# Patient Record
Sex: Male | Born: 1990 | Race: White | Hispanic: No | Marital: Single | Smoking: Never smoker
Health system: Southern US, Community
[De-identification: ages and names within clinical notes are randomized; demographics above are authoritative.]

---
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
## 2015-07-26 ENCOUNTER — Encounter (HOSPITAL_BASED_OUTPATIENT_CLINIC_OR_DEPARTMENT_OTHER): Payer: Self-pay | Admitting: Emergency Medicine

## 2015-07-26 ENCOUNTER — Emergency Department (HOSPITAL_BASED_OUTPATIENT_CLINIC_OR_DEPARTMENT_OTHER)
Admission: EM | Admit: 2015-07-26 | Discharge: 2015-07-26 | Disposition: A | Discharge status: Home / Self Care | Payer: No Typology Code available for payment source | Attending: Emergency Medicine | Admitting: Emergency Medicine

## 2015-07-26 DIAGNOSIS — S3992XA Unspecified injury of lower back, initial encounter: Secondary | ICD-10-CM | POA: Diagnosis not present

## 2015-07-26 DIAGNOSIS — M545 Low back pain, unspecified: Secondary | ICD-10-CM

## 2015-07-26 DIAGNOSIS — Y92410 Unspecified street and highway as the place of occurrence of the external cause: Secondary | ICD-10-CM | POA: Insufficient documentation

## 2015-07-26 DIAGNOSIS — Y9389 Activity, other specified: Secondary | ICD-10-CM | POA: Insufficient documentation

## 2015-07-26 DIAGNOSIS — Y998 Other external cause status: Secondary | ICD-10-CM | POA: Diagnosis not present

## 2015-07-26 MED ORDER — IBUPROFEN 800 MG PO TABS: 800.0000 mg | ORAL_TABLET | Freq: Three times a day (TID) | ORAL | Status: DC | PRN

## 2015-07-26 MED ORDER — METHOCARBAMOL 500 MG PO TABS: 500.0000 mg | ORAL_TABLET | Freq: Four times a day (QID) | ORAL | Status: DC | PRN
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
## 2015-08-05 ENCOUNTER — Emergency Department (HOSPITAL_BASED_OUTPATIENT_CLINIC_OR_DEPARTMENT_OTHER): Payer: No Typology Code available for payment source

## 2015-08-05 ENCOUNTER — Emergency Department (HOSPITAL_BASED_OUTPATIENT_CLINIC_OR_DEPARTMENT_OTHER)
Admission: EM | Admit: 2015-08-05 | Discharge: 2015-08-06 | Disposition: A | Discharge status: Home / Self Care | Payer: No Typology Code available for payment source | Attending: Emergency Medicine | Admitting: Emergency Medicine

## 2015-08-05 ENCOUNTER — Encounter (HOSPITAL_BASED_OUTPATIENT_CLINIC_OR_DEPARTMENT_OTHER): Payer: Self-pay | Admitting: *Deleted

## 2015-08-05 DIAGNOSIS — M545 Low back pain: Secondary | ICD-10-CM | POA: Insufficient documentation

## 2015-08-05 DIAGNOSIS — M546 Pain in thoracic spine: Secondary | ICD-10-CM | POA: Diagnosis not present

## 2015-08-05 DIAGNOSIS — M549 Dorsalgia, unspecified: Secondary | ICD-10-CM
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
## 2015-08-06 MED ORDER — HYDROCODONE-ACETAMINOPHEN 5-325 MG PO TABS: 1.0000 | ORAL_TABLET | Freq: Four times a day (QID) | ORAL | Status: DC | PRN

## 2015-08-06 MED ORDER — DIAZEPAM 5 MG PO TABS: 5.0000 mg | ORAL_TABLET | Freq: Three times a day (TID) | ORAL | Status: DC | PRN

## 2015-08-06 MED ORDER — KETOROLAC TROMETHAMINE 30 MG/ML IJ SOLN: 30.0000 mg | Freq: Once | INTRAMUSCULAR | Status: DC
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
## 2015-08-09 ENCOUNTER — Ambulatory Visit (INDEPENDENT_AMBULATORY_CARE_PROVIDER_SITE_OTHER): Payer: Self-pay | Admitting: Family Medicine

## 2015-08-09 ENCOUNTER — Encounter: Payer: Self-pay | Admitting: Family Medicine

## 2015-08-09 VITALS — BP 112/42 | HR 65 | Ht 72.0 in | Wt 195.0 lb

## 2015-08-09 DIAGNOSIS — M545 Low back pain, unspecified: Secondary | ICD-10-CM

## 2015-08-09 MED ORDER — PREDNISONE 10 MG PO TABS: ORAL_TABLET | ORAL | Status: DC
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
## 2015-08-10 ENCOUNTER — Ambulatory Visit: Payer: No Typology Code available for payment source | Attending: Family Medicine | Admitting: Physical Therapy

## 2015-08-10 ENCOUNTER — Encounter: Payer: Self-pay | Admitting: Physical Therapy

## 2015-08-10 DIAGNOSIS — M545 Low back pain: Secondary | ICD-10-CM | POA: Insufficient documentation

## 2015-08-10 DIAGNOSIS — M79605 Pain in left leg: Secondary | ICD-10-CM

## 2015-08-11 DIAGNOSIS — M545 Low back pain, unspecified: Secondary | ICD-10-CM | POA: Insufficient documentation
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
## 2015-08-17 ENCOUNTER — Ambulatory Visit: Payer: No Typology Code available for payment source | Admitting: Physical Therapy

## 2015-08-17 DIAGNOSIS — M545 Low back pain: Secondary | ICD-10-CM | POA: Diagnosis not present

## 2015-08-17 DIAGNOSIS — M79604 Pain in right leg: Secondary | ICD-10-CM

## 2015-08-17 NOTE — Therapy (Signed)
[facility] [address]  [address] [place], [place], [address] Phone: [phone]   Fax:  [phone]  Physical Therapy Treatment  Patient Details  Name: Jeremy Bridges MRN: [id number] Date of Birth: Feb 05, 1991 Referring Provider: Norton Blizzard  Encounter Date: 08/17/2015      PT End of Session - 08/17/15 1528    Visit Number 2   Number of Visits 12   Date for PT Re-Evaluation 09/22/15   PT Start Time 1446   PT Stop Time 1541   PT Time Calculation (min) 55 min   Activity Tolerance Patient tolerated treatment well;No increased pain   Behavior During Therapy [facility] for tasks assessed/performed      No past medical history on file.  No past surgical history on file.  There were no vitals filed for this visit.  Visit Diagnosis:  LBP radiating to both legs      Subjective Assessment - 08/17/15 1447    Subjective Back is "hit or miss."  Today c/o cramping, but Friday and Sunday were "rough."  Drove a lot on Sunday.  Back is fine up walking.  Pone on elbows stretch hurts: "feels like something is grinding together."   Patient Stated Goals get rid of pain, allow return to gym   Currently in Pain? Yes   Pain Score 2    Pain Location Back   Pain Orientation Lower   Pain Descriptors / Indicators Cramping   Pain Onset 1 to 4 weeks ago   Pain Frequency Constant   Aggravating Factors  prolonged sitting, prolonged standing, resting after being active   Pain Relieving Factors medication                         [facility] Adult PT Treatment/Exercise - 08/17/15 1449    Exercises   Exercises Lumbar   Lumbar Exercises: Stretches   Passive Hamstring Stretch 3 reps;20 seconds   Passive Hamstring Stretch Limitations supine with strap   Single Knee to Chest Stretch 3 reps;20 seconds   Lower Trunk Rotation 3 reps;20 seconds   Lumbar Exercises: Aerobic   Stationary Bike NuStep Level 6 x 5 min   Lumbar Exercises:  Quadruped   Opposite Arm/Leg Raise Right arm/Left leg;Left arm/Right leg;10 reps   Plank alt hip ext 3x20 sec   Modalities   Modalities Traction   Traction   Type of Traction Lumbar  mat slightly lowered to maximize lumbar pull   Min (lbs) 45   Max (lbs) 60   Hold Time 60   Rest Time 20   Time 15                PT Education - 08/17/15 1526    Education provided Yes   Education Details updated HEP   Person(s) Educated Patient   Methods Explanation;Demonstration;Handout   Comprehension Verbalized understanding;Returned demonstration          PT Short Term Goals - 08/17/15 1530    PT SHORT TERM GOAL #1   Title pt independent with initial HEP by 08/20/15   Status Achieved           PT Long Term Goals - 08/17/15 1530    PT LONG TERM GOAL #1   Title pt able to return to full particiaption in gym-based exercise and resistance training by 09/22/15   Status On-going   PT LONG TERM GOAL #2   Title pt able to hold / carry  is child without c/o LBP by 09/22/15   Status On-going   PT LONG TERM GOAL #3   Title pt able to sleep without need for medication and without limitation by LBP pain by 09/22/15   Status On-going   PT LONG TERM GOAL #4   Title pt able to perform all job duties to include driving without limitaion by LBP by 09/22/15   Status On-going               Plan - 08/17/15 1528    Clinical Impression Statement Pt tolerated session well today without increase in pain and pain only 2/10 today.  Extension based exercises increase pain with c/o "grinding" sensation.  Recommended d/c of extension based exercises.  Performed core and stretching/flexion based exercises today without increase in pain.   PT Next Visit Plan trunk stability as tolerated, stretch B HS, progress mechanical traction as tolerated - may change to prone?, taping and possible dry needling PRN   Consulted and Agree with Plan of Care Patient        Problem List Patient Active Problem  List   Diagnosis Date Noted  . Low back pain 08/11/2015   Clarita CraneStephanie F Samanatha Brammer, PT, DPT 08/17/2015 3:41 PM  [facility] [address]  [address] [place], [place], [address] Phone: [phone]   Fax:  [phone]  Name: Jeremy Bridges MRN: [id number] Date of Birth: 1991/08/15

## 2015-08-17 NOTE — Patient Instructions (Signed)
Hamstring Step 1    Straighten left knee. Keep knee level with other knee or on bolster. Hold _20__ seconds. Relax knee by returning foot to start. Repeat with other leg. Repeat _3__ times.  Copyright  VHI. All rights reserved.    Plank    Support body on ELBOWS and feet. Keep hips in line with torso and arms straight under chest. Avoid locking elbows. Extend one leg up, alternating for 20 seconds.   Repeat 3 times. ADVANCED: Extend one leg up.  Copyright  VHI. All rights reserved.

## 2015-08-19 ENCOUNTER — Ambulatory Visit: Payer: No Typology Code available for payment source | Admitting: Physical Therapy

## 2015-08-19 DIAGNOSIS — M545 Low back pain: Secondary | ICD-10-CM

## 2015-08-19 DIAGNOSIS — M79604 Pain in right leg: Secondary | ICD-10-CM

## 2015-08-19 NOTE — Therapy (Signed)
[facility] [place] [address]  [address] [place], [place], [address] Phone: [phone]   Fax:  [phone]  Physical Therapy Treatment  Patient Details  Name: Edison PaceRichard D Geddis MRN: [id number] Date of Birth: March 14, 1991 Referring Provider: Norton BlizzardShane Hudnall  Encounter Date: 08/19/2015      PT End of Session - 08/19/15 1458    Visit Number 3   Number of Visits 12   Date for PT Re-Evaluation 09/22/15   PT Start Time 1450   PT Stop Time 1538   PT Time Calculation (min) 48 min      No past medical history on file.  No past surgical history on file.  There were no vitals filed for this visit.  Visit Diagnosis:  LBP radiating to both legs      Subjective Assessment - 08/19/15 1451    Subjective States went to see chiro Tuesday and felt great for 3 hours following this but then pain returned. He states chiro advised him to not have traction while at PT because he wants to do that manually with him.  States noted pain while working today states he typically does not experience back pain while moving and being active rather usually after being active.  Noted n/t in R LE yesterday while sitting on stool in forward flexed posture.  States n/t abolished after walked around and hasn't returned.   Currently in Pain? Yes   Pain Score --  2-3/10 while working today; currently just sore   Pain Location Back   Pain Orientation Lower          TODAY'S TREATMENT TherEx - Stretch B HS R LE nerve glides L-spine extension self-mobes with towel roll to L3 area x 3' POE 2' (states lower l-spine grind pain not present just a numb sensation where towel was) Quadruped LE/UE 10x 3-way prayer stretch 2x20" each TRX DL Squat 95M15x (focus on maintaining l-spine lordosis due to tendency to tuck into flat l-spine with posterior pelvic tilt) TRX SL Squat 10x each TRX Y 10x        PT Education - 08/19/15 1519    Education provided Yes   Education Details instructed in extension approach to perform if notes radicular symptoms; encouraged regular latissimus stretching during resistance training   Person(s) Educated Patient   Methods Explanation;Demonstration   Comprehension Verbalized understanding;Returned demonstration          PT Short Term Goals - 08/17/15 1530    PT SHORT TERM GOAL #1   Title pt independent with initial HEP by 08/20/15   Status Achieved           PT Long Term Goals - 08/17/15 1530    PT LONG TERM GOAL #1   Title pt able to return to full particiaption in gym-based exercise and resistance training by 09/22/15   Status On-going   PT LONG TERM GOAL #2   Title pt able to hold / carry is child without c/o LBP by 09/22/15   Status On-going   PT LONG TERM GOAL #3   Title pt able to sleep without need for medication and without limitation by LBP pain by 09/22/15   Status On-going   PT LONG TERM GOAL #4   Title pt able to perform all job duties to include driving without limitaion by LBP by 09/22/15   Status On-going               Plan - 08/19/15 1801  Clinical Impression Statement Mr. Babilonia is being seen by PT as well as chiropractor.  He noted releif of symptoms after Tuesday (had PT then immediately following had a chiro appointment) stating was pain-free for 3 hours after treatments.  His pain returned and he also noted n/t throughoug R LE yesterday while performing seated work in forward flexed posture.  Reviewed disc mechanics again with pt and encouraged extension approach immediatly if he notes radicular symptoms.  Following chiro appointment, l-spine extension some easier and then easier still following mid l-spine self-mob with towel roll today.  Pt should improve well and quickly if he can avoid excessive flexion.  Pt states his chiro advised to not have traction here as he plans on performing that at his clinic manually and pt would like to abide by this.   PT Next Visit Plan trunk  stability training as tolerated with some extension bias approach; stretch B HS and lats; taping and possible dry needling PRN   Consulted and Agree with Plan of Care Patient        Problem List Patient Active Problem List   Diagnosis Date Noted  . Low back pain 08/11/2015    Havish Petties PT, OCS 08/19/2015, 6:08 PM  [facility] [address]  [address] [place], [place], [address] Phone: [phone]   Fax:  [phone]  Name: DAMIAN BUCKLES MRN: [id number] Date of Birth: Jan 0Hiemstra1, 1993

## 2015-08-24 ENCOUNTER — Ambulatory Visit: Payer: No Typology Code available for payment source | Admitting: Physical Therapy

## 2015-08-24 DIAGNOSIS — M79604 Pain in right leg: Secondary | ICD-10-CM

## 2015-08-24 DIAGNOSIS — M545 Low back pain: Secondary | ICD-10-CM

## 2015-08-24 NOTE — Therapy (Signed)
[facility] [address]  [address] [place], [place], [address] Phone: [phone]   Fax:  [phone]  Physical Therapy Treatment  Patient Details  Name: JOSEPH BIAS MRN: [id number] Date of Birth: [birth year] Referring Provider: Norton Blizzard  Encounter Date: 08/24/2015      PT End of Session - 08/24/15 0936    Visit Number 4   Number of Visits 12   Date for PT Re-Evaluation 09/22/15   PT Start Time 0850   PT Stop Time 0928   PT Time Calculation (min) 38 min   Activity Tolerance Patient tolerated treatment well;No increased pain   Behavior During Therapy [facility] for tasks assessed/performed      No past medical history on file.  No past surgical history on file.  There were no vitals filed for this visit.  Visit Diagnosis:  LBP radiating to both legs      Subjective Assessment - 08/24/15 0851    Subjective Back feels good; reports it's getting better.  "Hit or miss"  Driving still causes pain.   Currently in Pain? No/denies                         [facility] Adult PT Treatment/Exercise - 08/24/15 0854    Lumbar Exercises: Stretches   Passive Hamstring Stretch 3 reps;20 seconds   Passive Hamstring Stretch Limitations supine with strap   Prone on Elbows Stretch 2 reps;60 seconds   Prone Mid Back Stretch 3 reps;30 seconds   Prone Mid Back Stretch Limitations mid/R/L   Lumbar Exercises: Aerobic   Stationary Bike Elliptical x 5 min level 3   Lumbar Exercises: Supine   Bridge 15 reps   Bridge Limitations with strap for isometric hip abdct   Other Supine Lumbar Exercises isometric hip ext into physioball 15x5sec bil   Lumbar Exercises: Prone   Straight Leg Raise 10 reps   Opposite Arm/Leg Raise Right arm/Left leg;Left arm/Right leg;10 reps                  PT Short Term Goals - 08/17/15 1530    PT SHORT TERM GOAL #1   Title pt independent with initial HEP by 08/20/15   Status  Achieved           PT Long Term Goals - 08/17/15 1530    PT LONG TERM GOAL #1   Title pt able to return to full particiaption in gym-based exercise and resistance training by 09/22/15   Status On-going   PT LONG TERM GOAL #2   Title pt able to hold / carry is child without c/o LBP by 09/22/15   Status On-going   PT LONG TERM GOAL #3   Title pt able to sleep without need for medication and without limitation by LBP pain by 09/22/15   Status On-going   PT LONG TERM GOAL #4   Title pt able to perform all job duties to include driving without limitaion by LBP by 09/22/15   Status On-going               Plan - 08/24/15 0936    Clinical Impression Statement Pt tolerated exercises well without increase in pain today.  Continues to have periods of pain, especially with driving.  Discussed lumbar support for driving to assist with pain.   PT Next Visit Plan trunk stability training as tolerated with some extension bias approach; stretch B HS and lats; taping  and possible dry needling PRN   Consulted and Agree with Plan of Care Patient        Problem List Patient Active Problem List   Diagnosis Date Noted  . Low back pain 08/11/2015   Clarita CraneStephanie F Willena Jeancharles, PT, DPT 08/24/2015 9:38 AM  [facility] [place] [address]  [address] [place], [place], [address] Phone: [phone]   Fax:  [phone]  Name: Edison PaceRichard D Gordner MRN: [id number] Date of Birth: 02-05-1991

## 2015-08-26 ENCOUNTER — Ambulatory Visit: Payer: No Typology Code available for payment source | Admitting: Physical Therapy

## 2015-08-26 DIAGNOSIS — M545 Low back pain: Secondary | ICD-10-CM

## 2015-08-26 DIAGNOSIS — M79605 Pain in left leg: Secondary | ICD-10-CM

## 2015-08-26 NOTE — Therapy (Signed)
[facility] [address]  [address] [place], [place], [address] Phone: [phone]   Fax:  [phone]  Physical Therapy Treatment  Patient Details  Name: Jeremy Bridges MRN: [id number] Date of Birth: 1991/02/11 Referring Provider: Norton Blizzard  Encounter Date: 08/26/2015      PT End of Session - 08/26/15 1505    Visit Number 5   Number of Visits 12   Date for PT Re-Evaluation 09/22/15   PT Start Time 1502  pt late   PT Stop Time 1534   PT Time Calculation (min) 32 min      No past medical history on file.  No past surgical history on file.  There were no vitals filed for this visit.  Visit Diagnosis:  LBP radiating to both legs      Subjective Assessment - 08/26/15 1507    Subjective Reports 1/10 pain yesterday and no pain today.  No N/T since bout 4 days ago noted in L LE.  States feels as though is making good progress.   Currently in Pain? No/denies             TODAY'S TREATMENT TherEx - Stretch B HS B LE nerve glides Superman 8x5" 3-way prayer stretch 2x20" each L-spine extension self-mobes with towel roll to L3 area x 3' DL Bridge 69G SL Bridge 29B each TRX Hip Hinge into Y 10x TRX High Row 10x (focus on maintaining neutral trunk) TRX Push-up 10x (focus on maintaining neutral trunk) Free Squat with 10# DB at 90dg shoulder flexion 15x  Doorway Lunge 10x each           PT Short Term Goals - 08/17/15 1530    PT SHORT TERM GOAL #1   Title pt independent with initial HEP by 08/20/15   Status Achieved           PT Long Term Goals - 08/17/15 1530    PT LONG TERM GOAL #1   Title pt able to return to full particiaption in gym-based exercise and resistance training by 09/22/15   Status On-going   PT LONG TERM GOAL #2   Title pt able to hold / carry is child without c/o LBP by 09/22/15   Status On-going   PT LONG TERM GOAL #3   Title pt able to sleep without need for medication  and without limitation by LBP pain by 09/22/15   Status On-going   PT LONG TERM GOAL #4   Title pt able to perform all job duties to include driving without limitaion by LBP by 09/22/15   Status On-going               Plan - 08/26/15 1632    Clinical Impression Statement pt is progressing very well lately stating pain has been minimal lately and last bout of n/t was 4 days ago (which may have been more related to sitting on bucket causing peripheral nerve impingement ratther than l-spine nerve root impingment).  No pain with any activities during today's treatment.   PT Next Visit Plan trunk stability training as tolerated with some extension bias approach; stretch B HS and lats; taping and possible dry needling PRN   Consulted and Agree with Plan of Care Patient        Problem List Patient Active Problem List   Diagnosis Date Noted  . Low back pain 08/11/2015    Preslyn Warr PT, OCS 08/26/2015, 4:35 PM  [facility]  [place] [address]  [address] [place], [place], [address] Phone: [phone]   Fax:  [phone]  Name: Jeremy Bridges MRN: [id number] Date of Birth: [birth year]

## 2015-08-31 ENCOUNTER — Ambulatory Visit: Payer: No Typology Code available for payment source | Admitting: Physical Therapy

## 2015-08-31 DIAGNOSIS — M545 Low back pain: Secondary | ICD-10-CM

## 2015-08-31 DIAGNOSIS — M79604 Pain in right leg: Secondary | ICD-10-CM

## 2015-08-31 NOTE — Therapy (Signed)
[facility] [address]  [place], [place], [address] Phone: [phone]   Fax:  [phone]  Physical Therapy Treatment  Patient Details  Name: Jeremy Bridges MRN: [id number] Date of Birth: 1991/03/01 Referring Provider: Karlton Lemon  Encounter Date: 08/31/2015      PT End of Session - 08/31/15 1455    Visit Number 6   Number of Visits 12   Date for PT Re-Evaluation 09/22/15   PT Start Time 1450   PT Stop Time 1531   PT Time Calculation (min) 41 min      No past medical history on file.  No past surgical history on file.  There were no vitals filed for this visit.  Visit Diagnosis:  LBP radiating to both legs      Subjective Assessment - 08/31/15 1455    Subjective States LBP has been 0-1/10 lately with most pain noted while performing resisted l-spine extension at gym.  Denies noting n/t lately.   Currently in Pain? No/denies             TODAY'S TREATMENT TherEx - NuStep lvl 8, 4' Stretch B HS SL Bridge 15x each L-spine extension self-mobes with towel roll to L3 area x 3' In/out abdominals 44R (intermittent clicking in R anterior hip, abolished for a few reps with direct pressure to the area) Single Hand Low Row 35# 15x each Staggered Standing Mid Pulley one-arm row 15x each Fitter BW Lunge 2 Black with B Pole A  Staggered Standing mid pulley punch diagonal 20# 15x each TRX High Row 15x (focus on maintaining neutral trunk) TRX Push-up 15x (focus on maintaining neutral trunk) Free Squat on BOSU (down) with 10# DB at 90dg shoulder flexion 15x              PT Short Term Goals - 08/17/15 1530    PT SHORT TERM GOAL #1   Title pt independent with initial HEP by 08/20/15   Status Achieved           PT Long Term Goals - 08/31/15 1458    PT LONG TERM GOAL #1   Title pt able to return to full particiaption in gym-based exercise and resistance training by 09/22/15   Status  On-going   PT LONG TERM GOAL #2   Title pt able to hold / carry is child without c/o LBP by 09/22/15   Status Achieved   PT LONG TERM GOAL #3   Title pt able to sleep without need for medication and without limitation by LBP pain by 09/22/15   Status Achieved   PT LONG TERM GOAL #4   Title pt able to perform all job duties to include driving without limitaion by LBP by 09/22/15  continued discomfort with driving   Status Partially Met               Plan - 08/31/15 1533    Clinical Impression Statement Continued good progress - states is able to sleep without issue and is able to hold child without pain but still has pain with driving. Also noted short bout of pain with performing resisted trunk extension at gym over the weekend.  Denies noting LE n/t since last treatment.   PT Next Visit Plan progress trunk stability and hip extension   Consulted and Agree with Plan of Care Patient        Problem List Patient Active Problem List   Diagnosis Date Noted  .  Low back pain 08/11/2015    Susanne Baumgarner PT, OCS 08/31/2015, 4:17 PM  [facility] [address]  [place], [place], [address] Phone: [phone]   Fax:  [phone]  Name: Jeremy Bridges MRN: [id number] Date of Birth: 03/26/1991

## 2015-09-02 ENCOUNTER — Ambulatory Visit: Payer: No Typology Code available for payment source | Admitting: Physical Therapy

## 2015-09-02 DIAGNOSIS — M545 Low back pain, unspecified: Secondary | ICD-10-CM

## 2015-09-02 NOTE — Therapy (Signed)
[place] [address]  [place], [place], [address] Phone: [phone]   Fax:  [phone]  Physical Therapy Treatment  Patient Details  Name: Jeremy Bridges MRN: [id number] Date of Birth: 07/31/1991 Referring Provider: Karlton Lemon  Encounter Date: 09/02/2015      PT End of Session - 09/02/15 0931    Visit Number 7   Number of Visits 12   Date for PT Re-Evaluation 09/22/15   PT Start Time 0846   PT Stop Time 0924   PT Time Calculation (min) 38 min   Activity Tolerance Patient tolerated treatment well;No increased pain   Behavior During Therapy [facility] for tasks assessed/performed      No past medical history on file.  No past surgical history on file.  There were no vitals filed for this visit.  Visit Diagnosis:  LBP radiating to both legs      Subjective Assessment - 09/02/15 0849    Subjective No pain no radicular symptoms in past couple days.  No issues with driving since last week.   Currently in Pain? No/denies                         [facility] Adult PT Treatment/Exercise - 09/02/15 0850    Lumbar Exercises: Aerobic   Stationary Bike NuStep x 3 min L 8   Elliptical L5 x 3 min   Lumbar Exercises: Standing   Functional Squats 20 reps   Functional Squats Limitations on BOSU   Other Standing Lumbar Exercises trunk rotation with blue med ball x15 bil   Other Standing Lumbar Exercises squats with blue med ball and shoulder flexion x 15 on BOSU   Lumbar Exercises: Supine   Heel Slides 20 reps   Heel Slides Limitations alternating with ab set   Bent Knee Raise 20 reps   Bent Knee Raise Limitations alternating with ab set   Bridge 15 reps   Bridge Limitations single limb   Other Supine Lumbar Exercises bridging with hamstring curls on orange pball x 15   Other Supine Lumbar Exercises in/out abdominals x 20   Lumbar Exercises: Prone   Other Prone Lumbar Exercises prone planks 5x15  sec                  PT Short Term Goals - 08/17/15 1530    PT SHORT TERM GOAL #1   Title pt independent with initial HEP by 08/20/15   Status Achieved           PT Long Term Goals - 08/31/15 1458    PT LONG TERM GOAL #1   Title pt able to return to full particiaption in gym-based exercise and resistance training by 09/22/15   Status On-going   PT LONG TERM GOAL #2   Title pt able to hold / carry is child without c/o LBP by 09/22/15   Status Achieved   PT LONG TERM GOAL #3   Title pt able to sleep without need for medication and without limitation by LBP pain by 09/22/15   Status Achieved   PT LONG TERM GOAL #4   Title pt able to perform all job duties to include driving without limitaion by LBP by 09/22/15  continued discomfort with driving   Status Partially Met               Plan - 09/02/15 0932    Clinical Impression Statement Pt presents today  with 1 week of no symptoms.  Does state last time symptoms occurred was when driving for about 1 hour.  Continues to make good progress towards goals.    PT Next Visit Plan progress trunk stability and hip extension   Consulted and Agree with Plan of Care Patient        Problem List Patient Active Problem List   Diagnosis Date Noted  . Low back pain 08/11/2015   Laureen Abrahams, PT, DPT 09/02/2015 9:34 AM  [facility] [address]  [place], [place], [address] Phone: [phone]   Fax:  [phone]  Name: Jeremy Bridges MRN: [id number] Date of Birth: 05-Mar-1991

## 2015-09-03 ENCOUNTER — Encounter: Payer: Self-pay | Admitting: Family Medicine

## 2015-09-03 ENCOUNTER — Ambulatory Visit (INDEPENDENT_AMBULATORY_CARE_PROVIDER_SITE_OTHER): Payer: Self-pay | Admitting: Family Medicine

## 2015-09-03 VITALS — BP 113/75 | HR 68 | Ht 72.0 in | Wt 193.0 lb

## 2015-09-03 DIAGNOSIS — M545 Low back pain, unspecified: Secondary | ICD-10-CM

## 2015-09-03 NOTE — Patient Instructions (Signed)
Continue with home exercises another 4-6 weeks. Transition off physical therapy - continue for next week along with chiropractor. Follow up with me in 6 weeks or as needed.

## 2015-09-07 ENCOUNTER — Ambulatory Visit: Payer: No Typology Code available for payment source | Attending: Family Medicine | Admitting: Physical Therapy

## 2015-09-07 DIAGNOSIS — M79604 Pain in right leg: Secondary | ICD-10-CM

## 2015-09-07 DIAGNOSIS — M545 Low back pain: Secondary | ICD-10-CM | POA: Insufficient documentation
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
## 2015-09-07 NOTE — Therapy (Signed)
[facility] [place] [address]  [place], [place], [address] Phone: [phone]   Fax:  [phone]  Physical Therapy Treatment  Patient Details  Name: Jeremy Bridges MRN: [id number] Date of Birth: [birth year] Referring Provider: Karlton Lemon  Encounter Date: 09/07/2015      PT End of Session - 09/07/15 0858    Visit Number 8   Number of Visits 12   Date for PT Re-Evaluation 09/22/15   PT Start Time 0851   PT Stop Time 0934   PT Time Calculation (min) 43 min      No past medical history on file.  No past surgical history on file.  There were no vitals filed for this visit.  Visit Diagnosis:  LBP radiating to both legs      Subjective Assessment - 09/07/15 0853    Subjective no pain today but states noted some LBP yesterday extending into posterior R thigh to knee while sitting at rest.  States pain noted for approx 3 hours in lower back but only briefling in R LE.   Currently in Pain? No/denies   Pain Score --  4/10 yesterday   Pain Location Back   Pain Orientation --  lower to upper l-spine   Pain Descriptors / Indicators Aching           TODAY'S TREATMENT TherEx - Elliptical lvl 2.0 3' 3-way prayer stretch 2x20" each SKTC and DKTC Stretch B HS Partial Curl-up 20x L-spine extension self-mobes with towel roll to L3 area x 3' SL Bridge 15x each Staggered Standing Mid Pulley 25# one-arm row 15x each, 35# 15x each Fitter BW Lunge 2 Black with B Pole A 15x each, then with 2 Black + 1 Blue 15x each TRX Spiderman Push-up 10x each TRX BodySaw 30" 10x TRX Lunge with foot in TRX 10x each TRX One-Arm Row with Reach 10x each Free Squat on BOSU (down) with 5kg med ball at 90dg shoulder flexion 15x POE 1'           PT Short Term Goals - 08/17/15 1530    PT SHORT TERM GOAL #1   Title pt independent with initial HEP by 08/20/15   Status Achieved           PT Long Term Goals - 08/31/15  1458    PT LONG TERM GOAL #1   Title pt able to return to full particiaption in gym-based exercise and resistance training by 09/22/15   Status On-going   PT LONG TERM GOAL #2   Title pt able to hold / carry is child without c/o LBP by 09/22/15   Status Achieved   PT LONG TERM GOAL #3   Title pt able to sleep without need for medication and without limitation by LBP pain by 09/22/15   Status Achieved   PT LONG TERM GOAL #4   Title pt able to perform all job duties to include driving without limitaion by LBP by 09/22/15  continued discomfort with driving   Status Partially Met               Plan - 09/07/15 0940    Clinical Impression Statement no pain today but did experience LBP yesterday.  Initially noted while sitting and included pain into R LE but no n/t.  LE pain abolished with standing but pt states LBP pain last approx 3 hours at 4/10 intensity.  He states back pain was throughout entire l-spine.  Despite pain  yesterday, pt tolerated progression of intensity with stability training today very well.  No pain in lower back or LE noted.   PT Next Visit Plan progress trunk stability as tolerated   Consulted and Agree with Plan of Care Patient        Problem List Patient Active Problem List   Diagnosis Date Noted  . Low back pain 08/11/2015    HALL,RALPH PT, OCS 09/07/2015, 9:42 AM  [facility] [place] [address]  [address] [place], [place], [address] Phone: [phone]   Fax:  [phone]  Name: Jeremy Bridges MRN: [id number] Date of Birth: 05/18/1991     

## 2015-09-09 ENCOUNTER — Ambulatory Visit: Payer: No Typology Code available for payment source | Admitting: Physical Therapy

## 2015-09-09 DIAGNOSIS — M545 Low back pain, unspecified: Secondary | ICD-10-CM

## 2015-09-09 NOTE — Therapy (Signed)
[facility] [place] [address]  [place], [place], [address] Phone: [phone]   Fax:  [phone]  Physical Therapy Treatment  Patient Details  Name: Jeremy Bridges MRN: [id number] Date of Birth: 1991/06/12 Referring Provider: Karlton Lemon  Encounter Date: 09/09/2015      PT End of Session - 09/09/15 0900    Visit Number 9   Number of Visits 12   Date for PT Re-Evaluation 09/22/15   PT Start Time 0856   PT Stop Time 0934   PT Time Calculation (min) 38 min      No past medical history on file.  No past surgical history on file.  There were no vitals filed for this visit.  Visit Diagnosis:  LBP radiating to both legs      Subjective Assessment - 09/09/15 0857    Subjective States noted onset of lower back soreness yesterday afternoon and this has continued into this AM rating 3/10.  He states he believes pain is due to being in squat position for 3 hours at work.  States went for a run this AM despite lower back soreness and denies noting increased symptoms with this.  States is going to gym after PT today for shoulder workout then going to chiropractor after this.   Currently in Pain? Yes   Pain Score 3    Pain Location Back   Pain Orientation Lower          TODAY'S TREATMENT TherEx - B FOB (55cm) Tuck and Bridge combo 15x FOB LTR 15x FOB Bridge with Tuck 12x PBall (65cm) rollout to tabletop then ALT Knee Ext 2x6 each PBall (65cm) prone W with 4# dbs 12x PBall (65cm) prone Y with 4# 12x Low Row 65# 15x, 75# 15x Single Hand Low Row 35# 15x each Leg Press 55# 2x20 Quadruped UE/LE with elbow to contra knee tap 10x each Side Bridge into star 10x5" each                           PT Short Term Goals - 08/17/15 1530    PT SHORT TERM GOAL #1   Title pt independent with initial HEP by 08/20/15   Status Achieved           PT Long Term Goals - 08/31/15 1458    PT LONG TERM  GOAL #1   Title pt able to return to full particiaption in gym-based exercise and resistance training by 09/22/15   Status On-going   PT LONG TERM GOAL #2   Title pt able to hold / carry is child without c/o LBP by 09/22/15   Status Achieved   PT LONG TERM GOAL #3   Title pt able to sleep without need for medication and without limitation by LBP pain by 09/22/15   Status Achieved   PT LONG TERM GOAL #4   Title pt able to perform all job duties to include driving without limitaion by LBP by 09/22/15  continued discomfort with driving   Status Partially Met               Plan - 09/09/15 0903    Clinical Impression Statement Pt with lower back soreness since yesterday afternoon for unknown reason.  Denies n/t or pain extending into LE.  Due to soreness, decreased intensity of rx today and focused on mobility along with some stability but decreased rotation.  Well tolerated stating pain 2-3/10  after treatment.   PT Next Visit Plan progress trunk stability as tolerated   Consulted and Agree with Plan of Care Patient        Problem List Patient Active Problem List   Diagnosis Date Noted  . Low back pain 08/11/2015    Beatrice Ziehm PT, OCS 09/09/2015, 9:36 AM  [facility] [address]  [place], [place], [address] Phone: [phone]   Fax:  [phone]  Name: Jeremy Bridges MRN: [id number] Date of Birth: 1990/12/24

## 2015-09-14 ENCOUNTER — Ambulatory Visit: Payer: No Typology Code available for payment source | Admitting: Physical Therapy

## 2015-09-14 DIAGNOSIS — M545 Low back pain: Secondary | ICD-10-CM

## 2015-09-14 DIAGNOSIS — M79604 Pain in right leg: Secondary | ICD-10-CM

## 2015-09-14 NOTE — Therapy (Signed)
[facility] [place] [address]  [place], [place], [address] Phone: [phone]   Fax:  [phone]  Physical Therapy Treatment  Patient Details  Name: Jeremy Bridges MRN: [id number] Date of Birth: 11-Jun-1991 Referring Provider: Karlton Lemon  Encounter Date: 09/14/2015      PT End of Session - 09/14/15 0916    Visit Number 10   Number of Visits 12   Date for PT Re-Evaluation 09/22/15   PT Start Time 0908  pt late due to weather   PT Stop Time 0953   PT Time Calculation (min) 45 min      No past medical history on file.  No past surgical history on file.  There were no vitals filed for this visit.  Visit Diagnosis:  LBP radiating to both legs      Subjective Assessment - 09/14/15 0905    Subjective States had experienced sharp increase in lower back pain this past Saturday afternoon. States does not know reason for increase in pain stating did not participate in any activities other than working for around an hour.  States pain was contant rating 7/10 and was noted in lower back only.  Stated would experience sharp increase in pain with any trunk ROM especially with twisting and with leaning forward while sitting on sofa.  States tried supine lying and SKTC along with Constance Haw but states this seemed to make it worse. States pain eventually subsided without taking meds or using heat or ice.  States has been fine since that time noting just slight discomfort today which he rates 0.5/10. Pt currently seeing chiropractor 1x/wk on Thursdays   Currently in Pain? Yes   Pain Score --  0.5/10   Pain Location Back   Pain Orientation Lower          TODAY'S TREATMENT TherEx - POE 2'  Manual - x-hand L-spine long axis distraction and MFR  TherEx - Prone ALT SLR 10x each Fitter BW Lunge 2 black 2x15 each with B Pole A Fitter Lateral Lunge 1Black+1Blue 15x each SLS rotation Stab Blue TB 5x5" each 1/2 Kneeling  trunk rotation isometric Black TB 5x5" each 1/2 Kneeling Lifting Diagonal Black TB 10x each TRX Lunge with foot in TRX 12x each TRX Spiderman Push-up 10x each TRX Bridge with HS Curl 10x TRX BodySaw 2x10 slow TRX One-Arm Row with Reach 12x each               PT Short Term Goals - 08/17/15 1530    PT SHORT TERM GOAL #1   Title pt independent with initial HEP by 08/20/15   Status Achieved           PT Long Term Goals - 08/31/15 1458    PT LONG TERM GOAL #1   Title pt able to return to full particiaption in gym-based exercise and resistance training by 09/22/15   Status On-going   PT LONG TERM GOAL #2   Title pt able to hold / carry is child without c/o LBP by 09/22/15   Status Achieved   PT LONG TERM GOAL #3   Title pt able to sleep without need for medication and without limitation by LBP pain by 09/22/15   Status Achieved   PT LONG TERM GOAL #4   Title pt able to perform all job duties to include driving without limitaion by LBP by 09/22/15  continued discomfort with driving   Status Partially Met  Plan - 09/14/15 0959    Clinical Impression Statement pt noted sharp increase in LBP this past weekend which lasted approx 6 hours.  He cannot recall any reason for the pain increase.  States pain was in lower l-spine and did not extend above or below this point and denies n/t.  Did not take medication but states pain went away on its own and has not returned since.  Today's treatment started slow with low intensity extension biased activities.  No pain so progressed back to typical intensity of treatment and no pain noted.   PT Next Visit Plan progress trunk stability as tolerated   Consulted and Agree with Plan of Care Patient        Problem List Patient Active Problem List   Diagnosis Date Noted  . Low back pain 08/11/2015    Harlem Bula PT, OCS 09/14/2015, 10:03 AM  [facility] [address]  [place], [place], [address] Phone: [phone]   Fax:  [phone]  Name: Jeremy Bridges MRN: [id number] Date of Birth: Jan 27, 1991

## 2015-09-16 ENCOUNTER — Ambulatory Visit: Payer: No Typology Code available for payment source | Admitting: Physical Therapy

## 2015-09-16 DIAGNOSIS — M545 Low back pain: Secondary | ICD-10-CM

## 2015-09-16 DIAGNOSIS — M79604 Pain in right leg: Secondary | ICD-10-CM

## 2015-09-16 NOTE — Therapy (Signed)
[facility] [place] [address]  [place], [place], [address] Phone: [phone]   Fax:  [phone]  Physical Therapy Treatment  Patient Details  Name: Jeremy Bridges MRN: [id number] Date of Birth: October 21, 1990 Referring Provider: Karlton Lemon  Encounter Date: 09/16/2015      PT End of Session - 09/16/15 1537    Visit Number 11   Number of Visits 12   Date for PT Re-Evaluation 09/22/15   PT Start Time 1517   PT Stop Time 1538   PT Time Calculation (min) 50 min      No past medical history on file.  No past surgical history on file.  There were no vitals filed for this visit.  Visit Diagnosis:  LBP radiating to both legs      Subjective Assessment - 09/16/15 1447    Subjective States noting increased pain since yesterday PM.  States pain is noted with trunk rotation and bending forward to pick up stuff.  States pain began while lifting 90ish pound machine from behind counter with R hand.  Due to positioning of machine was unable to use proper mechanics.  Denies noting LE pain or N/T.  Has appointment with chiro at 4:15 today.   Currently in Pain? Yes   Pain Score --  pain up to 7-8/10 with movement today.  Short duration sharp pain.   Pain Location Back   Pain Orientation Lower;Right   Pain Descriptors / Indicators Sharp;Shooting   Pain Radiating Towards localized to R mid and lower l-spine, no TTP   Pain Onset Yesterday   Pain Frequency Occasional   Aggravating Factors  rotation, sometime with forward bending            TODAY'S TREATMENT Manual - x-hand L-spine long axis distraction and MFR  TherEx -  Quadruped UE/LE 10x Stretch SKTC, DKTC, HS Partial Curl-up 15x Reverse Curl-up 15x POE 2' Low Row 65# 15x, 85# 15x, 105# 15x Single Hand Low Row 35# 15x, 45# 15x High Row 85# 15x, 105# 10x Single Hand High Row 35# 15x, 45# 10x Staggered Standing Lifting Diagonal with 5kg ball 10x  each Staggered Standing lifting diagonal with Double Black TB 15x each            PT Education - 09/16/15 1746    Education provided Yes   Education Details lifting diagonal with black TB added to HEP   Person(s) Educated Patient   Methods Explanation;Demonstration   Comprehension Returned demonstration;Verbalized understanding          PT Short Term Goals - 08/17/15 1530    PT SHORT TERM GOAL #1   Title pt independent with initial HEP by 08/20/15   Status Achieved           PT Long Term Goals - 08/31/15 1458    PT LONG TERM GOAL #1   Title pt able to return to full particiaption in gym-based exercise and resistance training by 09/22/15   Status On-going   PT LONG TERM GOAL #2   Title pt able to hold / carry is child without c/o LBP by 09/22/15   Status Achieved   PT LONG TERM GOAL #3   Title pt able to sleep without need for medication and without limitation by LBP pain by 09/22/15   Status Achieved   PT LONG TERM GOAL #4   Title pt able to perform all job duties to include driving without limitaion by LBP by 09/22/15  continued discomfort with driving   Status Partially Met               Plan - 09/16/15 1747    Clinical Impression Statement pt arrived today with c/o increased R LBP since yesterday PM.  States pain began after performing one hand lift of 90 lb machine with flexed trunk.  He states machine was behind counter or washingmachine and therefore couldn't bend his legs and lift with his legs.  Has noted intense pain in R lower back with rotation ROM and sometimes with flexion and extension ROM since that time rating 7-8/10.  Performed light manual to this area today along with some mobility exercises after which he seemed to be pain-free.  We then progressed to more trunk stability training and tolerated this well without c/o back pain throughout treatment.   PT Next Visit Plan progress trunk stability as tolerated; will re-assess pt for PR to MD next  visit   Consulted and Agree with Plan of Care Patient        Problem List Patient Active Problem List   Diagnosis Date Noted  . Low back pain 08/11/2015    [facility] 09/16/2015, 5:52 PM  [facility] [address]  [place], [place], [address] Phone: [phone]   Fax:  [phone]  Name: Jeremy Bridges MRN: [id number] Date of Birth: 1991/06/21

## 2015-09-21 ENCOUNTER — Ambulatory Visit: Payer: No Typology Code available for payment source | Admitting: Physical Therapy

## 2015-09-21 DIAGNOSIS — M79604 Pain in right leg: Secondary | ICD-10-CM

## 2015-09-21 DIAGNOSIS — M545 Low back pain: Secondary | ICD-10-CM | POA: Diagnosis not present

## 2015-09-21 NOTE — Therapy (Signed)
[facility] [place] [address]  [place], [place], [address] Phone: [phone]   Fax:  [phone]  Physical Therapy Treatment  Patient Details  Name: Jeremy Bridges MRN: [id number] Date of Birth: Jul 25, 1991 Referring Provider: Karlton Lemon  Encounter Date: 09/21/2015      PT End of Session - 09/21/15 0855    Visit Number 12   Number of Visits 12   Date for PT Re-Evaluation 09/22/15   PT Start Time 0850   PT Stop Time 0941   PT Time Calculation (min) 51 min      No past medical history on file.  No past surgical history on file.  There were no vitals filed for this visit.  Visit Diagnosis:  LBP radiating to both legs      Subjective Assessment - 09/21/15 0852    Subjective States has been pain-free since last Friday noting decreasing pain following last PT treatment on Thursday.   Currently in Pain? No/denies          TODAY'S TREATMENT TherEx -  Rec Bike lvl 3, 4' Stretch: HS, Piri, SKTC, Prone Knee Flexion in Mod Thomas Prone Plank 30" Quadruplex with Elbow to Knee 10x 3-way prayer stretch PBall Curl-up 6# 10x with Pullover 5x5" for 2 sets B Knee Flexion Machine 45# 15x PBall Prone Walkout 5x Staggered Standing One-Arm Low Pulley Row 25# 15x,35# 15x TRX Y 15x Fitter Hip Extension 2 Black 20x each High Row 65# 15x, 75# 15x Low Row 95# 15x, 115# 15x           PT Short Term Goals - 08/17/15 1530    PT SHORT TERM GOAL #1   Title pt independent with initial HEP by 08/20/15   Status Achieved           PT Long Term Goals - 08/31/15 1458    PT LONG TERM GOAL #1   Title pt able to return to full particiaption in gym-based exercise and resistance training by 09/22/15   Status On-going   PT LONG TERM GOAL #2   Title pt able to hold / carry is child without c/o LBP by 09/22/15   Status Achieved   PT LONG TERM GOAL #3   Title pt able to sleep without need for medication and without  limitation by LBP pain by 09/22/15   Status Achieved   PT LONG TERM GOAL #4   Title pt able to perform all job duties to include driving without limitaion by LBP by 09/22/15  continued discomfort with driving   Status Partially Met               Plan - 09/21/15 1111    Clinical Impression Statement pt has been pain-free for past 4 days.  He is scheduled here later this week at which point we will determine how much more PT is recommended.  He's doing very good today but did have bout of increased pain last week so would like to make certain symptoms are more stable before discharging.  May place him on hold after next treatment - will discuss options with pt at next treatment.   PT Next Visit Plan assess for continue vs hold vs d/c   Consulted and Agree with Plan of Care Patient        Problem List Patient Active Problem List   Diagnosis Date Noted  . Low back pain 08/11/2015    Menashe Kafer PT, OCS 09/21/2015, 11:13 AM  [facility] [address]  [place], [place], [address] Phone: [phone]   Fax:  [phone]  Name: Jeremy Bridges MRN: [id number] Date of Birth: 02-05-1991

## 2015-09-23 ENCOUNTER — Ambulatory Visit: Payer: No Typology Code available for payment source | Admitting: Physical Therapy

## 2015-09-23 DIAGNOSIS — M545 Low back pain: Secondary | ICD-10-CM

## 2015-09-23 DIAGNOSIS — M79605 Pain in left leg: Secondary | ICD-10-CM

## 2015-09-23 NOTE — Therapy (Addendum)
[facility] [place] [address]  [place], [place], [address] Phone: [phone]   Fax:  [phone]  Physical Therapy Treatment  Patient Details  Name: Jeremy Bridges MRN: [id number] Date of Birth: 1991-09-04 Referring Provider: Karlton Lemon  Encounter Date: 09/23/2015      PT End of Session - 09/23/15 0854    Visit Number 13   PT Start Time 3500  pt late   PT Stop Time 0936   PT Time Calculation (min) 44 min      No past medical history on file.  No past surgical history on file.  There were no vitals filed for this visit.  Visit Diagnosis:  LBP radiating to both legs      Subjective Assessment - 09/23/15 0856    Subjective Still feeling good this week without c/o pain.   Currently in Pain? No/denies            TODAY'S TREATMENT TherEx - Bridge 15x SL Bridge 15x each Stretch B HS, SKTC in [place] with contra LE off edge of plinth, prone RF in [place] with contra LE off plinth edge  TherAct - floor to knuckle box lifts: 55# 6x, 80# 6x, 100# 6x (good mechanics, no pain) Floor-to-Chest with 90dg pivot 80# 5x to R and 5x to L (VC for mechanics on 1st lift but all others performed well and no c/o pain)  TherEx - 3-way prayer stretch Press-up 5x5" Plank with LE and contra UE raise 7x2" Staggered Standing One-Hand Row 25# 15x, 35# 15x Staggered standing one-hand punch diagonal 25# 15x, 35# 15x             PT Short Term Goals - 08/17/15 1530    PT SHORT TERM GOAL #1   Title pt independent with initial HEP by 08/20/15   Status Achieved           PT Long Term Goals - 09/23/15 0909    PT LONG TERM GOAL #1   Title pt able to return to full particiaption in gym-based exercise and resistance training by 09/22/15  is exercising at gym but hasn't returned to all exercises - is gradually doing so   Status Partially Met   PT LONG TERM GOAL #2   Title pt able to hold / carry is child  without c/o LBP by 09/22/15   Status Achieved   PT LONG TERM GOAL #3   Title pt able to sleep without need for medication and without limitation by LBP pain by 09/22/15   Status Achieved   PT LONG TERM GOAL #4   Title pt able to perform all job duties to include driving without limitaion by LBP  no pain in the past week   Status Achieved               Plan - 09/23/15 0856    Clinical Impression Statement pt has progressed well lately and has been pain-free for the past week.  He performed 100# floor level lifting today without back pain and performed 80# floor level lifting with a pivot without pain as well.  He did experience bout of increased pain last week but since this resolved he has not experienced any pain.  He has not noted radicular symptoms in LE for several weeks (last noted around 09/06/15).  Due to current symptom status and pt being athletic and familiar with exercise, he is being is placed on hold while he performs independent  exercise.  If pain returns or if he has questions / concerns regarding physical therapy interventions he is to contact us and return for treatment if necessary.   PT Next Visit Plan Pt on hold from PT for up to 30 days   Consulted and Agree with Plan of Care Patient        Problem List Patient Active Problem List   Diagnosis Date Noted  . Low back pain 08/11/2015    Valrie Jia PT, OCS 09/23/2015, 3:37 PM  [facility] [address]  [place], [place], [address] Phone: [phone]   Fax:  [phone]  Name: Jeremy Bridges MRN: [id number] Date of Birth: 1991/03/26   PHYSICAL THERAPY DISCHARGE SUMMARY Visits from Start of Care: 13   Current functional level related to goals / functional outcomes: Performing lifting activities without difficulty. I in HEP   Remaining deficits: No known deficits    Education / Equipment: HEP   Plan: Patient agrees to discharge.   Patient goals were met. Patient is being discharged due to meeting the stated rehab goals.  ?????   Celyn P. Helene Kelp PT, MPH 11/03/2015 12:17 PM

## 2015-09-28 ENCOUNTER — Ambulatory Visit: Payer: No Typology Code available for payment source

## 2015-09-30 ENCOUNTER — Ambulatory Visit: Payer: No Typology Code available for payment source

## 2015-10-05 ENCOUNTER — Ambulatory Visit: Payer: No Typology Code available for payment source | Admitting: Physical Therapy

## 2015-10-07 ENCOUNTER — Ambulatory Visit: Payer: No Typology Code available for payment source

## 2016-01-21 ENCOUNTER — Encounter: Payer: Self-pay | Admitting: Family Medicine

## 2016-01-21 ENCOUNTER — Ambulatory Visit (INDEPENDENT_AMBULATORY_CARE_PROVIDER_SITE_OTHER): Payer: Self-pay | Admitting: Family Medicine

## 2016-01-21 VITALS — BP 127/75 | HR 90 | Ht 72.0 in | Wt 195.0 lb

## 2016-01-21 DIAGNOSIS — M545 Low back pain, unspecified: Secondary | ICD-10-CM

## 2016-01-21 NOTE — Patient Instructions (Signed)
You have a deep lumbar strain (of the posture muscles, erector spinae and others). Let me know if you want to try an anti-inflammatory (mobic, diclofenac) and a pain medicine at night. Follow through with the chiropractic care. Do back exercises we discussed (swimmers, extensions, cat/camel). Also let me know if you want to add physical therapy. MRI is a consideration if you're not improving though it's unlikely you will need this. Follow up with me in 6 weeks.
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
## 2016-02-07 ENCOUNTER — Telehealth: Payer: Self-pay | Admitting: Family Medicine

## 2016-02-07 MED ORDER — METHOCARBAMOL 500 MG PO TABS: 500.0000 mg | ORAL_TABLET | Freq: Three times a day (TID) | ORAL | Status: DC | PRN

## 2016-02-07 MED ORDER — HYDROCODONE-ACETAMINOPHEN 5-325 MG PO TABS: 1.0000 | ORAL_TABLET | Freq: Four times a day (QID) | ORAL | Status: DC | PRN

## 2016-02-07 NOTE — Telephone Encounter (Signed)
He would have to pick up the pain medicine.  i sent in a muscle relaxant.  We can't provide refills of pain medication.

## 2016-02-11 NOTE — Telephone Encounter (Signed)
Prescription picked up. 

## 2016-04-18 ENCOUNTER — Other Ambulatory Visit: Payer: Self-pay | Admitting: Family Medicine

## 2018-07-16 ENCOUNTER — Emergency Department (HOSPITAL_COMMUNITY): Payer: Self-pay

## 2018-07-16 ENCOUNTER — Emergency Department (HOSPITAL_COMMUNITY)
Admission: EM | Admit: 2018-07-16 | Discharge: 2018-07-16 | Disposition: A | Discharge status: Home / Self Care | Payer: Self-pay | Attending: Emergency Medicine | Admitting: Emergency Medicine

## 2018-07-16 ENCOUNTER — Encounter (HOSPITAL_COMMUNITY): Payer: Self-pay | Admitting: Emergency Medicine

## 2018-07-16 DIAGNOSIS — Z79899 Other long term (current) drug therapy: Secondary | ICD-10-CM | POA: Insufficient documentation

## 2018-07-16 DIAGNOSIS — N200 Calculus of kidney: Secondary | ICD-10-CM | POA: Insufficient documentation

## 2018-07-16 LAB — URINALYSIS, ROUTINE W REFLEX MICROSCOPIC
Bilirubin Urine: NEGATIVE
Glucose, UA: NEGATIVE mg/dL
Ketones, ur: NEGATIVE mg/dL
Leukocytes, UA: NEGATIVE
Nitrite: NEGATIVE
Protein, ur: NEGATIVE mg/dL
Specific Gravity, Urine: 1.021 (ref 1.005–1.030)
pH: 6 (ref 5.0–8.0)

## 2018-07-16 LAB — BASIC METABOLIC PANEL
Anion gap: 8 (ref 5–15)
BUN: 13 mg/dL (ref 6–20)
CO2: 24 mmol/L (ref 22–32)
Calcium: 9.5 mg/dL (ref 8.9–10.3)
Chloride: 106 mmol/L (ref 98–111)
Creatinine, Ser: 1.23 mg/dL (ref 0.61–1.24)
GFR calc Af Amer: >60 mL/min (ref >60)
GFR calc non Af Amer: >60 mL/min (ref >60)
Glucose, Bld: 116 mg/dL — ABNORMAL HIGH (ref 70–99)
Potassium: 3.2 mmol/L — ABNORMAL LOW (ref 3.5–5.1)
Sodium: 138 mmol/L (ref 135–145)

## 2018-07-16 LAB — CBC WITH DIFFERENTIAL/PLATELET
Abs Immature Granulocytes: 0.02 10*3/uL (ref 0.00–0.07)
Basophils Absolute: 0 10*3/uL (ref 0.0–0.1)
Basophils Relative: 0 %
Eosinophils Absolute: 0.1 10*3/uL (ref 0.0–0.5)
Eosinophils Relative: 1 %
HCT: 45.5 % (ref 39.0–52.0)
Hemoglobin: 15 g/dL (ref 13.0–17.0)
Immature Granulocytes: 0 %
Lymphocytes Relative: 38 %
Lymphs Abs: 3.1 10*3/uL (ref 0.7–4.0)
MCH: 31.3 pg (ref 26.0–34.0)
MCHC: 33 g/dL (ref 30.0–36.0)
MCV: 94.8 fL (ref 80.0–100.0)
Monocytes Absolute: 0.5 10*3/uL (ref 0.1–1.0)
Monocytes Relative: 6 %
Neutro Abs: 4.4 10*3/uL (ref 1.7–7.7)
Neutrophils Relative %: 55 %
Platelets: 241 10*3/uL (ref 150–400)
RBC: 4.8 MIL/uL (ref 4.22–5.81)
RDW: 12.2 % (ref 11.5–15.5)
WBC: 8.2 10*3/uL (ref 4.0–10.5)
nRBC: 0 % (ref 0.0–0.2)

## 2018-07-16 MED ORDER — CEPHALEXIN 500 MG PO CAPS: 500.0000 mg | ORAL_CAPSULE | Freq: Three times a day (TID) | ORAL | 0 refills | Status: DC

## 2018-07-16 MED ORDER — KETOROLAC TROMETHAMINE 10 MG PO TABS: 10.0000 mg | ORAL_TABLET | Freq: Four times a day (QID) | ORAL | 0 refills | Status: DC | PRN

## 2018-07-16 MED ORDER — OXYCODONE-ACETAMINOPHEN 5-325 MG PO TABS: 1.0000 | ORAL_TABLET | Freq: Four times a day (QID) | ORAL | 0 refills | Status: DC | PRN

## 2018-07-16 MED ORDER — TAMSULOSIN HCL 0.4 MG PO CAPS: 0.4000 mg | ORAL_CAPSULE | Freq: Every day | ORAL | 0 refills | Status: DC

## 2018-07-16 MED ORDER — KETOROLAC TROMETHAMINE 30 MG/ML IJ SOLN
30.0000 mg | Freq: Once | INTRAMUSCULAR | Status: AC
  Administered 2018-07-16: 30 mg via INTRAVENOUS
  Filled 2018-07-16: qty 1

## 2018-07-16 NOTE — ED Triage Notes (Addendum)
Pt reports groin pain on right side slowly this morning. He thought maybe pulled muscle since worked out last night. He had sudden onset of pain to that area that radiated all over belly and he says it was so intense arms became numb. He had a BM and pain got worse. A&Ox4. No acute signs of distress. No trauma. No abnormality to groin area noted.

## 2018-07-16 NOTE — ED Provider Notes (Signed)
[facility] EMERGENCY DEPARTMENT Provider Note   CSN: [id number] Arrival date & time: 07/16/18  1306     History   Chief Complaint Chief Complaint  Patient presents with  . Groin Pain    HPI Jeremy Bridges is a 27 y.o. male.  HPI   27 year old male presents today with complaints of right-sided flank pain.  Patient notes sharp pain starting this morning worsening throughout the day.  Patient notes radiation down into his right testicle but denies any testicular pain.  Denies any urinary symptoms, denies any abdominal pain, he notes some nausea denies any vomiting, denies any fever.  No history of the same, no proceeding trauma.   History reviewed. No pertinent past medical history.  Patient Active Problem List   Diagnosis Date Noted  . Low back pain 08/11/2015    History reviewed. No pertinent surgical history.      Home Medications    Prior to Admission medications   Medication Sig Start Date End Date Taking? Authorizing Provider  diazepam (VALIUM) 5 MG tablet Take 1 tablet (5 mg total) by mouth every 8 (eight) hours as needed for muscle spasms. Patient not taking: Reported on 08/31/2015 08/06/15   Leta Baptist, MD  HYDROcodone-acetaminophen [facility]) 5-325 MG tablet Take 1 tablet by mouth every 6 (six) hours as needed for moderate pain or severe pain. 02/07/16   Hudnall, Azucena Fallen, MD  ibuprofen (ADVIL,MOTRIN) 800 MG tablet Take 1 tablet (800 mg total) by mouth 3 (three) times daily as needed for mild pain or moderate pain. Patient not taking: Reported on 08/31/2015 07/26/15   Trixie Dredge, PA-C  ketorolac (TORADOL) 10 MG tablet Take 1 tablet (10 mg total) by mouth every 6 (six) hours as needed. 07/16/18   Henretter Piekarski, Tinnie Gens, PA-C  methocarbamol (ROBAXIN) 500 MG tablet Take 1 tablet (500 mg total) by mouth every 8 (eight) hours as needed for muscle spasms (or pain). 02/07/16   Hudnall, Azucena Fallen, MD  oxyCODONE-acetaminophen (PERCOCET/ROXICET) 5-325 MG  tablet Take 1 tablet by mouth every 6 (six) hours as needed for severe pain. 07/16/18   Audrick Lamoureaux, Tinnie Gens, PA-C  predniSONE (DELTASONE) 10 MG tablet 6 tabs po day 1, 5 tabs po day 2, 4 tabs po day 3, 3 tabs po day 4, 2 tabs po day 5, 1 tab po day 6 Patient not taking: Reported on 08/31/2015 08/09/15   Lenda Kelp, MD  tamsulosin (FLOMAX) 0.4 MG CAPS capsule Take 1 capsule (0.4 mg total) by mouth daily. 07/16/18   Eyvonne Mechanic, PA-C    Family History History reviewed. No pertinent family history.  Social History Social History   Tobacco Use  . Smoking status: Never Smoker  . Smokeless tobacco: Current User  . Tobacco comment: 1 can per day  Substance Use Topics  . Alcohol use: Yes    Alcohol/week: 0.0 standard drinks    Comment: occ  . Drug use: No     Allergies   Codeine   Review of Systems Review of Systems  All other systems reviewed and are negative.   Physical Exam Updated Vital Signs BP 129/83   Pulse 73   Temp (!) 97.5 F (36.4 C) (Oral)   Resp 16   Ht 6' (1.829 m)   Wt 93 kg   SpO2 99%   BMI 27.80 kg/m   Physical Exam  Constitutional: He is oriented to person, place, and time. He appears well-developed and well-nourished.  HENT:  Head: Normocephalic and atraumatic.  Eyes: Pupils are equal, round, and reactive to light. Conjunctivae are normal. Right eye exhibits no discharge. Left eye exhibits no discharge. No scleral icterus.  Neck: Normal range of motion. No JVD present. No tracheal deviation present.  Pulmonary/Chest: Effort normal. No stridor.  Abdominal:  Abdomen soft nontender  Neurological: He is alert and oriented to person, place, and time. Coordination normal.  Psychiatric: He has a normal mood and affect. His behavior is normal. Judgment and thought content normal.  Nursing note and vitals reviewed.   ED Treatments / Results  Labs (all labs ordered are listed, but only abnormal results are displayed) Labs Reviewed  BASIC METABOLIC  PANEL - Abnormal; Notable for the following components:      Result Value   Potassium 3.2 (*)    Glucose, Bld 116 (*)    All other components within normal limits  CBC WITH DIFFERENTIAL/PLATELET  URINALYSIS, ROUTINE W REFLEX MICROSCOPIC    EKG None  Radiology Ct Renal Stone Study  Result Date: 07/16/2018 CLINICAL DATA:  Acute right flank pain EXAM: CT ABDOMEN AND PELVIS WITHOUT CONTRAST TECHNIQUE: Multidetector CT imaging of the abdomen and pelvis was performed following the standard protocol without IV contrast. COMPARISON:  01/17/2018 FINDINGS: Lower chest: No acute abnormality. Hepatobiliary: No focal liver abnormality is seen. No gallstones, gallbladder wall thickening, or biliary dilatation. Pancreas: Unremarkable. No pancreatic ductal dilatation or surrounding inflammatory changes. Spleen: Normal in size without focal abnormality. Adrenals/Urinary Tract: Normal adrenal glands. Right kidney demonstrates minor pelviectasis and associated very mild right hydroureter extending into the pelvis where there is an obstructing 3 mm right UVJ calculus, image 78 series 3. This is confirmed on the sagittal and coronal reconstructions. Left kidney and ureter demonstrate no acute process or obstruction. Bladder unremarkable. No additional intrarenal calculi on either side. Stomach/Bowel: Stomach is within normal limits. Appendix appears normal. No evidence of bowel wall thickening, distention, or inflammatory changes. Vascular/Lymphatic: Negative for aneurysm. Limited assessment without IV contrast. No adenopathy. Reproductive: Unremarkable by CT. Other: No abdominal wall hernia or abnormality. No abdominopelvic ascites. Musculoskeletal: No acute or significant osseous findings. IMPRESSION: 3 mm mildly obstructing right pelvic UVJ calculus with minor right pelviectasis and associated right hydroureter. Electronically Signed   By: Judie Petit.  Shick M.D.   On: 07/16/2018 14:30    Procedures Procedures (including  critical care time)  Medications Ordered in ED Medications  ketorolac (TORADOL) 30 MG/ML injection 30 mg (30 mg Intravenous Given 07/16/18 1355)     Initial Impression / Assessment and Plan / ED Course  I have reviewed the triage vital signs and the nursing notes.  Pertinent labs & imaging results that were available during my care of the patient were reviewed by me and considered in my medical decision making (see chart for details).     Labs:   Imaging: Prerenal, CBC, BMP  Consults:  Therapeutics:  Discharge Meds: Percocet, Toradol, Flomax  Assessment/Plan: 27 year old male with uncomplicated kidney stone.  Pain improved with Toradol discharged with pain medication outpatient urology and strict return precautions.  Verbalized understanding and agreement to today's plan.    Final Clinical Impressions(s) / ED Diagnoses   Final diagnoses:  Kidney stone    ED Discharge Orders         Ordered    ketorolac (TORADOL) 10 MG tablet  Every 6 hours PRN     07/16/18 1533    oxyCODONE-acetaminophen (PERCOCET/ROXICET) 5-325 MG tablet  Every 6 hours PRN     07/16/18 1533  tamsulosin (FLOMAX) 0.4 MG CAPS capsule  Daily     07/16/18 1534           Eyvonne Mechanic, PA-C 07/16/18 1536    Margarita Grizzle, MD 07/16/18 1902

## 2018-07-16 NOTE — ED Notes (Signed)
ED Provider at bedside. 

## 2018-07-16 NOTE — ED Notes (Signed)
Patient verbalizes understanding of discharge instructions. Opportunity for questioning and answers were provided. Armband removed by staff, pt discharged from ED. Pt refused to provide additional urine sample, lab said possible to add on will have to see EDP aware. Pt refused discharge VS. Pt ambulatory to lobby.

## 2018-07-16 NOTE — Discharge Instructions (Signed)
Please read attached information. If you experience any new or worsening signs or symptoms please return to the emergency room for evaluation. Please follow-up with your primary care provider or specialist as discussed. Please use medication prescribed only as directed and discontinue taking if you have any concerning signs or symptoms.   °

## 2018-07-17 LAB — URINE CULTURE: Culture: 20000 — AB

## 2018-07-18 ENCOUNTER — Telehealth: Payer: Self-pay | Admitting: *Deleted

## 2018-07-18 NOTE — Telephone Encounter (Signed)
Post ED Visit - Positive Culture Follow-up  Culture report reviewed by antimicrobial stewardship pharmacist:  []  Enzo BiNathan Batchelder, Pharm.D. []  Celedonio MiyamotoJeremy Frens, Pharm.D., BCPS AQ-ID []  Garvin FilaMike Maccia, Pharm.D., BCPS []  Georgina PillionElizabeth Martin, Pharm.D., BCPS []  Point MarionMinh Pham, VermontPharm.D., BCPS, AAHIVP []  Estella HuskMichelle Turner, Pharm.D., BCPS, AAHIVP []  Lysle Pearlachel Rumbarger, PharmD, BCPS []  Phillips Climeshuy Dang, PharmD, BCPS []  Agapito GamesAlison Masters, PharmD, BCPS []  Verlan FriendsErin Deja, PharmD Bradley FerrisJoshua Tessin, PharmD  Positive urine culture Treated with Cephalexin, organism sensitive to the same and no further patient follow-up is required at this time.  Virl AxeRobertson, Kobe [facility] 07/18/2018, 10:29 AM

## 2019-12-17 ENCOUNTER — Encounter (HOSPITAL_COMMUNITY): Payer: Self-pay

## 2019-12-17 ENCOUNTER — Emergency Department (HOSPITAL_COMMUNITY): Payer: Self-pay

## 2019-12-17 ENCOUNTER — Emergency Department (HOSPITAL_COMMUNITY)
Admission: EM | Admit: 2019-12-17 | Discharge: 2019-12-17 | Disposition: A | Discharge status: Home / Self Care | Payer: Self-pay | Attending: Emergency Medicine | Admitting: Emergency Medicine

## 2019-12-17 ENCOUNTER — Encounter (HOSPITAL_COMMUNITY): Payer: Self-pay | Admitting: Emergency Medicine

## 2019-12-17 ENCOUNTER — Ambulatory Visit (HOSPITAL_COMMUNITY)
Admission: EM | Admit: 2019-12-17 | Discharge: 2019-12-17 | Disposition: A | Discharge status: Home / Self Care | Payer: Self-pay

## 2019-12-17 ENCOUNTER — Other Ambulatory Visit: Payer: Self-pay

## 2019-12-17 DIAGNOSIS — R0789 Other chest pain: Secondary | ICD-10-CM | POA: Insufficient documentation

## 2019-12-17 DIAGNOSIS — Z79899 Other long term (current) drug therapy: Secondary | ICD-10-CM | POA: Insufficient documentation

## 2019-12-17 DIAGNOSIS — R5383 Other fatigue: Secondary | ICD-10-CM | POA: Insufficient documentation

## 2019-12-17 DIAGNOSIS — R0602 Shortness of breath: Secondary | ICD-10-CM

## 2019-12-17 DIAGNOSIS — R079 Chest pain, unspecified: Secondary | ICD-10-CM

## 2019-12-17 DIAGNOSIS — R42 Dizziness and giddiness: Secondary | ICD-10-CM

## 2019-12-17 DIAGNOSIS — R002 Palpitations: Secondary | ICD-10-CM

## 2019-12-17 LAB — CBC
HCT: 47.3 % (ref 39.0–52.0)
Hemoglobin: 15.7 g/dL (ref 13.0–17.0)
MCH: 31.8 pg (ref 26.0–34.0)
MCHC: 33.2 g/dL (ref 30.0–36.0)
MCV: 95.9 fL (ref 80.0–100.0)
Platelets: 246 10*3/uL (ref 150–400)
RBC: 4.93 MIL/uL (ref 4.22–5.81)
RDW: 12.4 % (ref 11.5–15.5)
WBC: 8.4 10*3/uL (ref 4.0–10.5)
nRBC: 0 % (ref 0.0–0.2)

## 2019-12-17 LAB — BASIC METABOLIC PANEL
Anion gap: 10 (ref 5–15)
BUN: 10 mg/dL (ref 6–20)
CO2: 22 mmol/L (ref 22–32)
Calcium: 9.2 mg/dL (ref 8.9–10.3)
Chloride: 106 mmol/L (ref 98–111)
Creatinine, Ser: 1.21 mg/dL (ref 0.61–1.24)
GFR calc Af Amer: >60 mL/min (ref >60)
GFR calc non Af Amer: >60 mL/min (ref >60)
Glucose, Bld: 97 mg/dL (ref 70–99)
Potassium: 4.5 mmol/L (ref 3.5–5.1)
Sodium: 138 mmol/L (ref 135–145)

## 2019-12-17 LAB — TROPONIN I (HIGH SENSITIVITY): Troponin I (High Sensitivity): 4 ng/L (ref <18)

## 2019-12-17 NOTE — ED Notes (Signed)
Highest HR he is aware of last night was 96 bpm

## 2019-12-17 NOTE — ED Triage Notes (Signed)
Long term issues with left tonsil being swollen. Has been meaning to see ENT.  He has also been having 15- 20 minute episodes of palpitations, fast heart rate, shortness of breath, and dizziness. He has one of those episodes last night.  He went into work this morning and experienced some exertional shortness of breath and lightheadedness so he came here.

## 2019-12-17 NOTE — Discharge Instructions (Signed)
Go to the ED for further evaluation and treatment. Need to rule out PE.

## 2019-12-17 NOTE — ED Provider Notes (Signed)
Jeremy Bridges   CSN: [id number] Arrival date & time: 12/17/19  1345     History Chief Complaint  Patient presents with  . Dizziness  . Shortness of Breath  . Chest Pain    Jeremy Bridges is a 29 y.o. male.  HPI Patient presents with shortness of breath and chest pain.  Came from urgent care.  States last night began to feel his heart beating irregularly and had some dizziness and shortness of breath.  States he has been more fatigued.  States did drink some caffeine today but that is not unusual for him.  Went to work and still had pain in his left chest going to his left neck and somewhat down his left arm.  States he could feel his heart beating irregularly to.  States his neighbor is a Marine scientist at [employer] and said that his heart was "out of rhythm.  He states that she was told that he was PVCs.  Does not smoke.  Family history of A. fib in grandparents.  No swelling his leg.  No fever.  No cough.  Pain is dull.    History reviewed. No pertinent past medical history.  Patient Active Problem List   Diagnosis Date Noted  . Low back pain 08/11/2015    History reviewed. No pertinent surgical history.     No family history on file.  Social History   Tobacco Use  . Smoking status: Never Smoker  . Smokeless tobacco: Current User    Types: Chew  . Tobacco comment: 1 can per day  Substance Use Topics  . Alcohol use: Yes    Alcohol/week: 0.0 standard drinks    Comment: occ  . Drug use: No    Home Medications Prior to Admission medications   Medication Sig Start Date End Date Taking? Authorizing Provider  levocetirizine (XYZAL) 5 MG tablet Take 5 mg by mouth in the morning.    Yes [provider]  cephALEXin (KEFLEX) 500 MG capsule Take 1 capsule (500 mg total) by mouth 3 (three) times daily. Patient not taking: Reported on 12/17/2019 07/16/18   Pattricia Boss, MD  diazepam (VALIUM) 5 MG tablet Take 1  tablet (5 mg total) by mouth every 8 (eight) hours as needed for muscle spasms. Patient not taking: Reported on 08/31/2015 08/06/15   Harvel Quale, MD  HYDROcodone-acetaminophen [facility]) 5-325 MG tablet Take 1 tablet by mouth every 6 (six) hours as needed for moderate pain or severe pain. Patient not taking: Reported on 12/17/2019 02/07/16   Dene Gentry, MD  ibuprofen (ADVIL,MOTRIN) 800 MG tablet Take 1 tablet (800 mg total) by mouth 3 (three) times daily as needed for mild pain or moderate pain. Patient not taking: Reported on 08/31/2015 07/26/15   Clayton Bibles, PA-C  ketorolac (TORADOL) 10 MG tablet Take 1 tablet (10 mg total) by mouth every 6 (six) hours as needed. Patient not taking: Reported on 12/17/2019 07/16/18   Hedges, Dellis Filbert, PA-C  methocarbamol (ROBAXIN) 500 MG tablet Take 1 tablet (500 mg total) by mouth every 8 (eight) hours as needed for muscle spasms (or pain). Patient not taking: Reported on 12/17/2019 02/07/16   Dene Gentry, MD  oxyCODONE-acetaminophen (PERCOCET/ROXICET) 5-325 MG tablet Take 1 tablet by mouth every 6 (six) hours as needed for severe pain. Patient not taking: Reported on 12/17/2019 07/16/18   Hedges, Dellis Filbert, PA-C  predniSONE (DELTASONE) 10 MG tablet 6 tabs po day 1, 5 tabs po  day 2, 4 tabs po day 3, 3 tabs po day 4, 2 tabs po day 5, 1 tab po day 6 Patient not taking: Reported on 08/31/2015 08/09/15   Lenda Kelp, MD  tamsulosin (FLOMAX) 0.4 MG CAPS capsule Take 1 capsule (0.4 mg total) by mouth daily. Patient not taking: Reported on 12/17/2019 07/16/18   Eyvonne Mechanic, PA-C    Allergies    Codeine, Flomax [tamsulosin], and Toradol [ketorolac tromethamine]  Review of Systems   Review of Systems  Constitutional: Negative for appetite change.  HENT: Negative for congestion.   Cardiovascular: Positive for chest pain and palpitations. Negative for leg swelling.  Gastrointestinal: Negative for abdominal pain.  Genitourinary: Negative for flank pain.   Musculoskeletal: Negative for back pain.  Skin: Negative for rash.  Neurological: Negative for weakness.  Psychiatric/Behavioral: Negative for confusion.    Physical Exam Updated Vital Signs BP 130/83   Pulse 76   Temp 98.1 F (36.7 C) (Oral)   Resp 18   Ht 6' (1.829 m)   Wt 99.8 kg   SpO2 100%   BMI 29.84 kg/m   Physical Exam Vitals and nursing Bridges reviewed.  HENT:     Head: Normocephalic.  Eyes:     Extraocular Movements: Extraocular movements intact.  Cardiovascular:     Rate and Rhythm: Normal rate and regular rhythm.  Pulmonary:     Breath sounds: No wheezing, rhonchi or rales.  Chest:     Chest wall: No tenderness.  Abdominal:     Tenderness: There is no abdominal tenderness.  Musculoskeletal:     Cervical back: Neck supple.     Right lower leg: No tenderness. No edema.     Left lower leg: No tenderness. No edema.  Skin:    General: Skin is warm.     Capillary Refill: Capillary refill takes less than 2 seconds.  Neurological:     Mental Status: He is alert and oriented to person, place, and time.     ED Results / Procedures / Treatments   Labs (all labs ordered are listed, but only abnormal results are displayed) Labs Reviewed  BASIC METABOLIC PANEL  CBC  TROPONIN I (HIGH SENSITIVITY)    EKG EKG Interpretation  Date/Time:  Wednesday December 17 2019 13:52:56 EDT Ventricular Rate:  71 PR Interval:  130 QRS Duration: 82 QT Interval:  368 QTC Calculation: 399 R Axis:   64 Text Interpretation: Normal sinus rhythm with sinus arrhythmia Normal ECG Confirmed by Benjiman Core [phone]) on 12/17/2019 5:14:10 PM   Radiology DG Chest 2 View  Result Date: 12/17/2019 CLINICAL DATA:  Chest pain and shortness of breath EXAM: CHEST - 2 VIEW COMPARISON:  Jan 18, 2008 FINDINGS: The lungs are clear. The heart size and pulmonary vascularity are normal. No adenopathy. No pneumothorax. No bone lesions. IMPRESSION: No abnormality noted. Electronically Signed   By:  Bretta Bang III M.D.   On: 12/17/2019 14:24    Procedures Procedures (including critical care time)  Medications Ordered in ED Medications - No data to display  ED Course  I have reviewed the triage vital signs and the nursing notes.  Pertinent labs & imaging results that were available during my care of the patient were reviewed by me and considered in my medical decision making (see chart for details).    MDM Rules/Calculators/A&P                      Patient with shortness of breath and  palpitations.  States he felt his heart out of rhythm.  Only mild sinus arrhythmia here.  Troponin negative.  X-ray reassuring.  Doubt cardiac ischemia.  Patient states he thinks he had Covid back in January.  Will not recheck at this time.  Will have cardiology follow-up with outpatient for his palpitations/arrhythmia. Final Clinical Impression(s) / ED Diagnoses Final diagnoses:  Nonspecific chest pain  Palpitations    Rx / DC Orders ED Discharge Orders    None       Benjiman Core, MD 12/17/19 1845

## 2019-12-17 NOTE — ED Provider Notes (Signed)
[facility]    CSN: [id number] Arrival date & time: 12/17/19  1240      History   Chief Complaint Chief Complaint  Patient presents with  . Palpitations  . Oral Swelling    HPI Jeremy Bridges is a 29 y.o. male.   Patient reports that he has had an episode of dizziness, shortness of breath on exertion, chest pain that radiates to the neck last night.  Reports that he thinks this may have lasted almost 2 hours.  Reports during this time he felt like he was going to pass out the whole time.  Reports that he had his neighbor, listen to his heart, who is a Engineer, civil (consulting) and states that his heart was "out of rhythm."  Patient reports that he went to work this morning, and he was fine for the first hour or 2, and then he had another episode.  Reports that during his visit right now, he is having left-sided chest pain that is radiating to his neck and shortness of breath on exertion.  Denies that he has any cardiac history.  Reports that both of his parents have A. fib and that his grandparents have had open heart surgery as well as A. fib.  ROS per HPI  The history is provided by the patient.  Palpitations   History reviewed. No pertinent past medical history.  Patient Active Problem List   Diagnosis Date Noted  . Low back pain 08/11/2015    History reviewed. No pertinent surgical history.     Home Medications    Prior to Admission medications   Medication Sig Start Date End Date Taking? Authorizing Provider  levocetirizine (XYZAL) 5 MG tablet Take 5 mg by mouth in the morning.    Yes [provider]  cephALEXin (KEFLEX) 500 MG capsule Take 1 capsule (500 mg total) by mouth 3 (three) times daily. Patient not taking: Reported on 12/17/2019 07/16/18   Margarita Grizzle, MD  diazepam (VALIUM) 5 MG tablet Take 1 tablet (5 mg total) by mouth every 8 (eight) hours as needed for muscle spasms. Patient not taking: Reported on 08/31/2015 08/06/15   Leta Baptist, MD    HYDROcodone-acetaminophen [facility]) 5-325 MG tablet Take 1 tablet by mouth every 6 (six) hours as needed for moderate pain or severe pain. Patient not taking: Reported on 12/17/2019 02/07/16   Lenda Kelp, MD  ibuprofen (ADVIL,MOTRIN) 800 MG tablet Take 1 tablet (800 mg total) by mouth 3 (three) times daily as needed for mild pain or moderate pain. Patient not taking: Reported on 08/31/2015 07/26/15   Trixie Dredge, PA-C  ketorolac (TORADOL) 10 MG tablet Take 1 tablet (10 mg total) by mouth every 6 (six) hours as needed. Patient not taking: Reported on 12/17/2019 07/16/18   Hedges, Tinnie Gens, PA-C  methocarbamol (ROBAXIN) 500 MG tablet Take 1 tablet (500 mg total) by mouth every 8 (eight) hours as needed for muscle spasms (or pain). Patient not taking: Reported on 12/17/2019 02/07/16   Lenda Kelp, MD  oxyCODONE-acetaminophen (PERCOCET/ROXICET) 5-325 MG tablet Take 1 tablet by mouth every 6 (six) hours as needed for severe pain. Patient not taking: Reported on 12/17/2019 07/16/18   Hedges, Tinnie Gens, PA-C  predniSONE (DELTASONE) 10 MG tablet 6 tabs po day 1, 5 tabs po day 2, 4 tabs po day 3, 3 tabs po day 4, 2 tabs po day 5, 1 tab po day 6 Patient not taking: Reported on 08/31/2015 08/09/15   Lenda Kelp, MD  tamsulosin (FLOMAX) 0.4 MG CAPS capsule Take 1 capsule (0.4 mg total) by mouth daily. Patient not taking: Reported on 12/17/2019 07/16/18   Okey Regal, PA-C    Family History No family history on file.  Social History Social History   Tobacco Use  . Smoking status: Never Smoker  . Smokeless tobacco: Current User    Types: Chew  . Tobacco comment: 1 can per day  Substance Use Topics  . Alcohol use: Yes    Alcohol/week: 0.0 standard drinks    Comment: occ  . Drug use: No     Allergies   Codeine, Flomax [tamsulosin], and Toradol [ketorolac tromethamine]   Review of Systems Review of Systems  Cardiovascular: Positive for palpitations.     Physical Exam Triage Vital  Signs ED Triage Vitals  Enc Vitals Group     BP 12/17/19 1259 133/84     Pulse Rate 12/17/19 1259 78     Resp 12/17/19 1259 16     Temp 12/17/19 1259 98.5 F (36.9 C)     Temp Source 12/17/19 1259 Oral     SpO2 12/17/19 1259 100 %     Weight --      Height --      Head Circumference --      Peak Flow --      Pain Score 12/17/19 1257 0     Pain Loc --      Pain Edu? --      Excl. in [place]? --    No data found.  Updated Vital Signs BP 133/84   Pulse 78   Temp 98.5 F (36.9 C) (Oral)   Resp 16   SpO2 100%   Visual Acuity Right Eye Distance:   Left Eye Distance:   Bilateral Distance:    Right Eye Near:   Left Eye Near:    Bilateral Near:     Physical Exam Constitutional:      General: He is in acute distress.     Appearance: Normal appearance. He is normal weight. He is ill-appearing.  HENT:     Head: Normocephalic and atraumatic.     Right Ear: Tympanic membrane normal.     Left Ear: Tympanic membrane normal.     Nose: Nose normal.  Cardiovascular:     Rate and Rhythm: Normal rate and regular rhythm.     Heart sounds: Normal heart sounds.  Pulmonary:     Effort: No respiratory distress.     Breath sounds: No stridor. No wheezing, rhonchi or rales.  Chest:     Chest wall: No tenderness.  Abdominal:     General: Bowel sounds are normal.  Musculoskeletal:     Cervical back: Normal range of motion.  Skin:    General: Skin is warm.     Capillary Refill: Capillary refill takes less than 2 seconds.  Neurological:     General: No focal deficit present.     Mental Status: He is alert and oriented to person, place, and time.  Psychiatric:        Mood and Affect: Mood normal.        Behavior: Behavior normal.        Thought Content: Thought content normal.      UC Treatments / Results  Labs (all labs ordered are listed, but only abnormal results are displayed) Labs Reviewed - No data to display  EKG   Radiology   Procedures Procedures (including  critical care time)  Medications Ordered in  UC Medications - No data to display  Initial Impression / Assessment and Plan / UC Course  I have reviewed the triage vital signs and the nursing notes.  Pertinent labs & imaging results that were available during my care of the patient were reviewed by me and considered in my medical decision making (see chart for details).     Patient experiencing palpitations, chest pain, shortness of breath on exertion, dizziness and lightheadedness.  Patient instructed to go to the ER for further evaluation and treatment to rule out a PE as well as cardiac work-up.  Patient wheeled to ED from this office.  Stable at discharge. Final Clinical Impressions(s) / UC Diagnoses   Final diagnoses:  Palpitations  Chest pain, unspecified type  SOBOE (shortness of breath on exertion)  Dizziness and giddiness  Lightheadedness     Discharge Instructions     Go to the ED for further evaluation and treatment. Need to rule out PE.    ED Prescriptions    None     PDMP not reviewed this encounter.   Moshe Cipro, NP 12/17/19 1821

## 2019-12-17 NOTE — ED Notes (Signed)
Patient is being discharged from the [facility]gent [facility] and sent to the Emergency Department via wheelchair by staff. Per Moshe Cipro, NP, patient is stable but in need of higher level of care due to Palpitations, CP, SOB, dizziness. Patient is aware and verbalizes understanding of plan of care.  Vitals:   12/17/19 1259  BP: 133/84  Pulse: 78  Resp: 16  Temp: 98.5 F (36.9 C)  SpO2: 100%

## 2019-12-17 NOTE — ED Notes (Signed)
Patient ambulated with pulse ox. Sats 100% RA. Reports mild SOB with exertion but no distress noted with ambulation.

## 2019-12-17 NOTE — ED Triage Notes (Signed)
Pt reports dizziness, sob and chest pain that started last night around 8pm, woke up with pain still there. Pt sent here from UC for further eval. Pt a.o, nad noted

## 2019-12-17 NOTE — Discharge Instructions (Signed)
Follow-up with cardiology for your palpitations.

## 2021-10-11 ENCOUNTER — Emergency Department (HOSPITAL_BASED_OUTPATIENT_CLINIC_OR_DEPARTMENT_OTHER)
Admission: EM | Admit: 2021-10-11 | Discharge: 2021-10-11 | Disposition: A | Discharge status: Home / Self Care | Payer: Self-pay | Attending: Emergency Medicine | Admitting: Emergency Medicine

## 2021-10-11 ENCOUNTER — Emergency Department (HOSPITAL_BASED_OUTPATIENT_CLINIC_OR_DEPARTMENT_OTHER): Payer: Self-pay

## 2021-10-11 ENCOUNTER — Encounter (HOSPITAL_BASED_OUTPATIENT_CLINIC_OR_DEPARTMENT_OTHER): Payer: Self-pay | Admitting: Urology

## 2021-10-11 DIAGNOSIS — W268XXA Contact with other sharp object(s), not elsewhere classified, initial encounter: Secondary | ICD-10-CM | POA: Insufficient documentation

## 2021-10-11 DIAGNOSIS — S61019A Laceration without foreign body of unspecified thumb without damage to nail, initial encounter: Secondary | ICD-10-CM | POA: Insufficient documentation

## 2021-10-11 NOTE — ED Provider Notes (Addendum)
[facility] Provider Note   CSN: [id number] Arrival date & time: 10/11/21  1148  History Chief Complaint  Patient presents with   Laceration    Jeremy Bridges is a 31 y.o. male.  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  Gasket slipped and cut right thumb.  Bleeding was able to stop with applied pressure.  Denies any numbness or tingling,decrease in sensation or movement. Last Tetanus 2007.  Not interested in updating vaccination.  Home Medications Prior to Admission medications   Medication Sig Start Date End Date Taking? Authorizing Provider  cephALEXin (KEFLEX) 500 MG capsule Take 1 capsule (500 mg total) by mouth 3 (three) times daily. Patient not taking: Reported on 12/17/2019 07/16/18   Pattricia Boss, MD  diazepam (VALIUM) 5 MG tablet Take 1 tablet (5 mg total) by mouth every 8 (eight) hours as needed for muscle spasms. Patient not taking: Reported on 08/31/2015 08/06/15   Harvel Quale, MD  HYDROcodone-acetaminophen [facility]) 5-325 MG tablet Take 1 tablet by mouth every 6 (six) hours as needed for moderate pain or severe pain. Patient not taking: Reported on 12/17/2019 02/07/16   Dene Gentry, MD  ibuprofen (ADVIL,MOTRIN) 800 MG tablet Take 1 tablet (800 mg total) by mouth 3 (three) times daily as needed for mild pain or moderate pain. Patient not taking: Reported on 08/31/2015 07/26/15   Clayton Bibles, PA-C  ketorolac (TORADOL) 10 MG tablet Take 1 tablet (10 mg total) by mouth every 6 (six) hours as needed. Patient not taking: Reported on 12/17/2019 07/16/18   Hedges, Dellis Filbert, PA-C  levocetirizine (XYZAL) 5 MG tablet Take 5 mg by mouth in the morning.     [provider]  methocarbamol (ROBAXIN) 500 MG tablet Take 1 tablet (500 mg total) by mouth every 8 (eight) hours as needed for muscle spasms (or pain). Patient not taking: Reported on 12/17/2019 02/07/16   Dene Gentry, MD  oxyCODONE-acetaminophen (PERCOCET/ROXICET) 5-325 MG  tablet Take 1 tablet by mouth every 6 (six) hours as needed for severe pain. Patient not taking: Reported on 12/17/2019 07/16/18   Hedges, Dellis Filbert, PA-C  predniSONE (DELTASONE) 10 MG tablet 6 tabs po day 1, 5 tabs po day 2, 4 tabs po day 3, 3 tabs po day 4, 2 tabs po day 5, 1 tab po day 6 Patient not taking: Reported on 08/31/2015 08/09/15   Dene Gentry, MD  tamsulosin (FLOMAX) 0.4 MG CAPS capsule Take 1 capsule (0.4 mg total) by mouth daily. Patient not taking: Reported on 12/17/2019 07/16/18   Okey Regal, PA-C      Allergies    Codeine, Flomax [tamsulosin], and Toradol [ketorolac tromethamine]    Review of Systems   Review of Systems  Physical Exam Updated Vital Signs BP 133/81 (BP Location: Right Arm)    Pulse (!) 115    Temp 98 F (36.7 C) (Oral)    Resp 18    Ht 6' (1.829 m)    Wt 104.3 kg    SpO2 99%    BMI 31.19 kg/m  Physical Exam Musculoskeletal:        General: Signs of injury present. No swelling or deformity.     Comments: Linear laceration along palmer surface of left thumb.  Hemostasis has been achieved.  Skin:    Capillary Refill: Capillary refill takes less than 2 seconds.    ED Results / Procedures / Treatments   Labs (all labs ordered are listed, but only abnormal results are displayed)  Labs Reviewed - No data to display  EKG None  Radiology DG Finger Thumb Left  Result Date: 10/11/2021 CLINICAL DATA:  Left thumb laceration EXAM: LEFT THUMB 2+V COMPARISON:  07/04/2006 FINDINGS: Subtle cortical irregularity along the ulnar cortex of the left thumb distal phalanx, new from prior. No complete fracture line. No malalignment. Joint spaces are preserved. There is irregularity in the overlying soft tissues compatible with laceration. No radiopaque foreign body is seen within the soft tissues. IMPRESSION: 1. Subtle cortical irregularity along the ulnar cortex of the left thumb distal phalanx, new from prior and likely posttraumatic. 2. Laceration of the thumb  without radiopaque foreign body. Electronically Signed   By: Davina Poke D.O.   On: 10/11/2021 12:27    Procedures .Marland KitchenLaceration Repair  Date/Time: 10/11/2021 3:08 PM Performed by: Carollee Leitz, MD Authorized by: Lianne Cure, DO   Consent:    Consent obtained:  Verbal   Consent given by:  Patient   Risks, benefits, and alternatives were discussed: yes     Risks discussed:  Infection and pain Universal protocol:    Procedure explained and questions answered to patient or proxy's satisfaction: yes     Imaging studies available: yes     Patient identity confirmed:  Verbally with patient Anesthesia:    Anesthesia method:  Nerve block   Block location:  Left thumb   Block needle gauge:  25 G   Block anesthetic:  Lidocaine 1% w/o epi   Block injection procedure:  Anatomic landmarks identified and introduced needle   Block outcome:  Anesthesia achieved Laceration details:    Location:  Finger   Finger location:  L thumb Pre-procedure details:    Preparation:  Imaging obtained to evaluate for foreign bodies and patient was prepped and draped in usual sterile fashion Exploration:    Hemostasis achieved with:  Direct pressure   Imaging obtained: x-ray     Imaging outcome: foreign body not noted     Contaminated: no   Treatment:    Area cleansed with:  Saline   Amount of cleaning:  Standard   Irrigation solution:  Sterile water   Irrigation method:  Syringe Skin repair:    Repair method:  Sutures   Suture size:  3-0   Suture material:  Prolene   Suture technique:  Simple interrupted   Number of sutures:  6 Approximation:    Approximation:  Close Repair type:    Repair type:  Simple Post-procedure details:    Dressing:  Non-adherent dressing   Procedure completion:  Tolerated well, no immediate complications Comments:     Discussed importance of receiving Tetanus vaccination.  Patient declined Tdap. Archie Endo Block  Date/Time: 10/11/2021 4:03 PM Performed by: Lianne Cure, DO Authorized by: Lianne Cure, DO   Consent:    Consent obtained:  Verbal   Consent given by:  Patient   Risks, benefits, and alternatives were discussed: yes     Risks discussed:  Infection, nerve damage and swelling Universal protocol:    Immediately prior to procedure, a time out was called: yes     Patient identity confirmed:  Verbally with patient, arm band and provided demographic data Indications:    Indications:  Pain relief Location:    Laterality:  Left Procedure details:    Anesthetic injected:  Lidocaine 1% w/o epi Post-procedure details:    Procedure completion:  Tolerated well, no immediate complications   Medications Ordered in ED Medications - No data to display  ED Course/ Medical Decision Making/ A&P  Jeremy Bridges is a 31 y.o. male who presented to the ED for recent laceration to left thumb while at work.  Last Tdap 2007.  Xray negative for foreign body.  On exam left thumb is well perfused, cap refill <2 secs, FROM and sensation intact.  Discussed procedure with patient and agreeable to proceed.  Offered Tdap vaccine given not UTD.  Patient declined.    Laceration repaired. Patient tolerated procedure well.  Discussed signs and symptoms of infection.  Strict return precautions provided.  Follow up in 10 day for suture removal.    Ready for discharge home.                          Medical Decision Making Amount and/or Complexity of Data Reviewed Radiology: ordered.   Final Clinical Impression(s) / ED Diagnoses Final diagnoses:  Laceration of thumb without foreign body without damage to nail, unspecified laterality, initial encounter    Rx / DC Orders ED Discharge Orders     None         Carollee Leitz, MD 10/11/21 1525    Carollee Leitz, MD [id number]    Tyme[place] P, DO [phone]    Lianne Cure, DO [id number]

## 2021-10-11 NOTE — ED Notes (Signed)
Presents with left thumb laceration, currently bleeding under control, thumb soaked in normal saline to clean laceration site. Suture Cart to bedside. Pt positioned for comfort

## 2021-10-11 NOTE — ED Notes (Signed)
Pt. Is soaking the L thumb at present time.

## 2021-10-11 NOTE — Discharge Instructions (Addendum)
Recommend Tdap booster.  Keep area clean.  If any increased pain, fevers, or increased redness please follow up with PCP or return to Emergency Department.  Return for suture removal in 10 days.  Can take Tylenol every 4-6 hours for discomfort.

## 2021-10-11 NOTE — ED Triage Notes (Signed)
Left thumb lac at work on metal gasket  Bleeding controlled  Tetanus unknown

## 2024-05-05 ENCOUNTER — Other Ambulatory Visit: Payer: Self-pay

## 2024-05-05 ENCOUNTER — Telehealth: Payer: Self-pay | Admitting: Physician Assistant

## 2024-05-05 ENCOUNTER — Ambulatory Visit
Admission: EM | Admit: 2024-05-05 | Discharge: 2024-05-05 | Disposition: A | Discharge status: Home / Self Care | Payer: Self-pay | Attending: Family Medicine | Admitting: Family Medicine

## 2024-05-05 DIAGNOSIS — R369 Urethral discharge, unspecified: Secondary | ICD-10-CM | POA: Insufficient documentation

## 2024-05-05 DIAGNOSIS — Z113 Encounter for screening for infections with a predominantly sexual mode of transmission: Secondary | ICD-10-CM | POA: Insufficient documentation

## 2024-05-05 DIAGNOSIS — R300 Dysuria: Secondary | ICD-10-CM | POA: Insufficient documentation

## 2024-05-05 LAB — POCT URINE DIPSTICK
Bilirubin, UA: NEGATIVE
Blood, UA: NEGATIVE
Glucose, UA: NEGATIVE mg/dL
Ketones, POC UA: NEGATIVE mg/dL
Leukocytes, UA: NEGATIVE
Nitrite, UA: NEGATIVE
POC PROTEIN,UA: NEGATIVE
Spec Grav, UA: 1.025 (ref 1.010–1.025)
Urobilinogen, UA: 0.2 U/dL
pH, UA: 6.5 (ref 5.0–8.0)

## 2024-05-05 NOTE — Discharge Instructions (Addendum)
 The clinical contact you with results of the testing done today if positive.  Please follow-up with your PCP if your symptoms do not improve.  Please go to the ER for any worsening symptoms.  Hope you feel better soon!

## 2024-05-05 NOTE — ED Triage Notes (Signed)
 Pt c/o dysuriax4d. Pt states it's worse in the morning. Pt c/o white/green penile discharge this morning. Pt states took a home UTI test and it was positive both times

## 2024-05-05 NOTE — ED Provider Notes (Addendum)
 [facility]    CSN: [id number] Arrival date & time: 05/05/24  1828      History   Chief Complaint Chief Complaint  Patient presents with   Dysuria   Penile Discharge    HPI Jeremy Bridges is a 33 y.o. male presents for penile discharge and dysuria.  Patient reports 4 days of urinary burning with penile discharge.  Denies any testicular pain or swelling, hematuria, urgency, frequency, fevers, nausea/vomiting, flank pain, difficulty starting or stopping the urine stream..  No known STD exposure.  He did a home test for UTI and states it was positive.  No other concerns at this time   Dysuria Presenting symptoms: dysuria and penile discharge   Penile Discharge    History reviewed. No pertinent past medical history.  Patient Active Problem List   Diagnosis Date Noted   Low back pain 08/11/2015    History reviewed. No pertinent surgical history.     Home Medications    Prior to Admission medications   Medication Sig Start Date End Date Taking? Authorizing Provider  GLUTATHIONE IJ Inject as directed.   Yes [provider]  TESTOSTERONE IM Inject 1.6 mLs into the muscle once a week.   Yes [provider]  levocetirizine (XYZAL) 5 MG tablet Take 5 mg by mouth in the morning.     [provider]    Family History History reviewed. No pertinent family history.  Social History Social History   Tobacco Use   Smoking status: Never   Smokeless tobacco: Current    Types: Chew   Tobacco comments:    1 can per day  Substance Use Topics   Alcohol use: Yes    Alcohol/week: 0.0 standard drinks of alcohol    Comment: occ   Drug use: No     Allergies   Codeine, Flomax  [tamsulosin ], and Toradol  [ketorolac  tromethamine ]   Review of Systems Review of Systems  Genitourinary:  Positive for dysuria and penile discharge.     Physical Exam Triage Vital Signs ED Triage Vitals  Encounter Vitals Group     BP 05/05/24 1837 (!)  142/82     Girls Systolic BP Percentile --      Girls Diastolic BP Percentile --      Boys Systolic BP Percentile --      Boys Diastolic BP Percentile --      Pulse Rate 05/05/24 1837 82     Resp 05/05/24 1837 17     Temp 05/05/24 1837 98.5 F (36.9 C)     Temp Source 05/05/24 1837 Oral     SpO2 05/05/24 1837 95 %     Weight --      Height --      Head Circumference --      Peak Flow --      Pain Score 05/05/24 1834 0     Pain Loc --      Pain Education --      Exclude from Growth Chart --    No data found.  Updated Vital Signs BP (!) 142/82   Pulse 82   Temp 98.5 F (36.9 C) (Oral)   Resp 17   SpO2 95%   Visual Acuity Right Eye Distance:   Left Eye Distance:   Bilateral Distance:    Right Eye Near:   Left Eye Near:    Bilateral Near:     Physical Exam Vitals and nursing note reviewed.  Constitutional:  Appearance: Normal appearance.  HENT:     Head: Normocephalic and atraumatic.  Eyes:     Pupils: Pupils are equal, round, and reactive to light.  Cardiovascular:     Rate and Rhythm: Normal rate.  Pulmonary:     Effort: Pulmonary effort is normal.  Skin:    General: Skin is warm and dry.  Neurological:     General: No focal deficit present.     Mental Status: He is alert and oriented to person, place, and time.  Psychiatric:        Mood and Affect: Mood normal.        Behavior: Behavior normal.      UC Treatments / Results  Labs (all labs ordered are listed, but only abnormal results are displayed) Labs Reviewed  MISC [facility] TEST (SEND OUT)  POCT URINE DIPSTICK  CYTOLOGY, (ORAL, ANAL, URETHRAL) ANCILLARY ONLY    EKG   Radiology No results found.  Procedures Procedures (including critical care time)  Medications Ordered in UC Medications - No data to display  Initial Impression / Assessment and Plan / UC Course  I have reviewed the triage vital signs and the nursing notes.  Pertinent labs & imaging results that were available  during my care of the patient were reviewed by me and considered in my medical decision making (see chart for details).     Reviewed exam and symptoms with patient.  STD testing is ordered we will contact for any positive results.  UA negative for UTI.  Patient prefers to wait results prior to initiating any treatment.  PCP follow-up if symptoms do not improve.  ER precautions reviewed Final Clinical Impressions(s) / UC Diagnoses   Final diagnoses:  Dysuria  Penile discharge  Screening examination for STD (sexually transmitted disease)     Discharge Instructions      The clinical contact you with results of the testing done today if positive.  Please follow-up with your PCP if your symptoms do not improve.  Please go to the ER for any worsening symptoms.  Hope you feel better soon!     ED Prescriptions   None    PDMP not reviewed this encounter.   Loreda Myla SAUNDERS, NP 05/05/24 1901    Loreda Myla SAUNDERS, NP 05/05/24 1902

## 2024-05-05 NOTE — Progress Notes (Signed)
 E-Visit for Urinary Problems  Based on what you shared with me, I feel your condition warrants further evaluation and I recommend that you be seen for a face to face office visit.  Male bladder infections are not very common.  We worry about prostate or kidney conditions.  The standard of care is to examine the abdomen and kidneys, and to do a urine and blood test to make sure that something more serious is not going on.  We recommend that you see a provider today.  If your doctor's office is closed [facility] has the following Urgent Cares:   NOTE: There will be NO CHARGE for this E-Visit   If you are having a true medical emergency, please call 911.     For an urgent face to face visit, [facility] has multiple urgent care centers for your convenience.  Click the link below for the full list of locations and hours, walk-in wait times, appointment scheduling options and driving directions:  Urgent Care - [place], [place], [place], [place], [place], [place]  [place]     Your MyChart E-visit questionnaire answers were reviewed by a board certified advanced clinical practitioner to complete your personal care plan based on your specific symptoms.  Thank you for using e-Visits.        I have spent 5 minutes in review of e-visit questionnaire, review and updating patient chart, medical decision making and response to patient.   Delon CHRISTELLA Dickinson, PA-C

## 2024-05-06 LAB — CYTOLOGY, (ORAL, ANAL, URETHRAL) ANCILLARY ONLY
Chlamydia: NEGATIVE
Comment: NEGATIVE
Comment: NEGATIVE
Comment: NORMAL
Neisseria Gonorrhea: NEGATIVE
Trichomonas: NEGATIVE

## 2024-05-09 LAB — MISC LABCORP TEST (SEND OUT): Labcorp test code: 180076
